# Patient Record
Sex: Female | Born: 1945 | Race: White | Hispanic: No | Marital: Single
Health system: Southern US, Community
[De-identification: ages and names within clinical notes are randomized; demographics above are authoritative.]

---

## 2004-03-25 ENCOUNTER — Ambulatory Visit: Payer: Self-pay | Admitting: Internal Medicine

## 2004-04-06 ENCOUNTER — Ambulatory Visit: Payer: Self-pay | Admitting: Internal Medicine

## 2004-11-23 ENCOUNTER — Ambulatory Visit: Payer: Self-pay | Admitting: Internal Medicine

## 2004-12-06 ENCOUNTER — Ambulatory Visit: Payer: Self-pay | Admitting: Internal Medicine

## 2005-01-03 ENCOUNTER — Emergency Department: Payer: Self-pay | Admitting: Emergency Medicine

## 2005-04-19 ENCOUNTER — Ambulatory Visit: Payer: Self-pay | Admitting: Internal Medicine

## 2005-06-06 ENCOUNTER — Ambulatory Visit: Payer: Self-pay | Admitting: Internal Medicine

## 2006-06-21 ENCOUNTER — Ambulatory Visit: Payer: Self-pay | Admitting: Internal Medicine

## 2007-08-06 ENCOUNTER — Ambulatory Visit: Payer: Self-pay | Admitting: Internal Medicine

## 2007-11-26 ENCOUNTER — Ambulatory Visit: Payer: Self-pay | Admitting: Gastroenterology

## 2008-01-14 ENCOUNTER — Ambulatory Visit: Payer: Self-pay | Admitting: Gastroenterology

## 2008-08-10 ENCOUNTER — Ambulatory Visit: Payer: Self-pay | Admitting: Family Medicine

## 2008-12-04 ENCOUNTER — Emergency Department: Payer: Self-pay | Admitting: Emergency Medicine

## 2009-09-27 ENCOUNTER — Ambulatory Visit: Payer: Self-pay | Admitting: Family Medicine

## 2010-12-15 ENCOUNTER — Ambulatory Visit: Payer: Self-pay | Admitting: Family Medicine

## 2011-05-29 LAB — URINALYSIS, COMPLETE
Bilirubin,UR: NEGATIVE
Glucose,UR: NEGATIVE mg/dL (ref 0–75)
Nitrite: NEGATIVE
Protein: 100
RBC,UR: 9 /HPF (ref 0–5)
Specific Gravity: 1.014 (ref 1.003–1.030)
WBC UR: 252 /HPF (ref 0–5)

## 2011-05-29 LAB — CBC
HGB: 12.7 g/dL (ref 12.0–16.0)
Platelet: 123 10*3/uL — ABNORMAL LOW (ref 150–440)
RBC: 3.81 10*6/uL (ref 3.80–5.20)
WBC: 19.3 10*3/uL — ABNORMAL HIGH (ref 3.6–11.0)

## 2011-05-29 LAB — COMPREHENSIVE METABOLIC PANEL
Albumin: 3 g/dL — ABNORMAL LOW (ref 3.4–5.0)
BUN: 24 mg/dL — ABNORMAL HIGH (ref 7–18)
Bilirubin,Total: 0.3 mg/dL (ref 0.2–1.0)
Calcium, Total: 9.1 mg/dL (ref 8.5–10.1)
Chloride: 99 mmol/L (ref 98–107)
Co2: 34 mmol/L — ABNORMAL HIGH (ref 21–32)
Creatinine: 2.25 mg/dL — ABNORMAL HIGH (ref 0.60–1.30)
Glucose: 134 mg/dL — ABNORMAL HIGH (ref 65–99)
Potassium: 3.8 mmol/L (ref 3.5–5.1)
Sodium: 141 mmol/L (ref 136–145)
Total Protein: 6.9 g/dL (ref 6.4–8.2)

## 2011-05-29 LAB — LIPASE, BLOOD: Lipase: 63 U/L — ABNORMAL LOW (ref 73–393)

## 2011-05-30 ENCOUNTER — Inpatient Hospital Stay: Payer: Self-pay | Admitting: Internal Medicine

## 2011-05-30 LAB — BASIC METABOLIC PANEL
Calcium, Total: 8.1 mg/dL — ABNORMAL LOW (ref 8.5–10.1)
Chloride: 107 mmol/L (ref 98–107)
Co2: 29 mmol/L (ref 21–32)
EGFR (African American): 47 — ABNORMAL LOW
Osmolality: 290 (ref 275–301)
Sodium: 143 mmol/L (ref 136–145)

## 2011-05-30 LAB — CBC WITH DIFFERENTIAL/PLATELET
Basophil #: 0 10*3/uL (ref 0.0–0.1)
Eosinophil %: 0 %
HCT: 33.7 % — ABNORMAL LOW (ref 35.0–47.0)
HGB: 11.2 g/dL — ABNORMAL LOW (ref 12.0–16.0)
MCH: 33.1 pg (ref 26.0–34.0)
MCHC: 33.2 g/dL (ref 32.0–36.0)
MCV: 100 fL (ref 80–100)
RBC: 3.38 10*6/uL — ABNORMAL LOW (ref 3.80–5.20)

## 2011-05-30 LAB — MAGNESIUM: Magnesium: 1.5 mg/dL — ABNORMAL LOW

## 2011-05-31 LAB — CBC WITH DIFFERENTIAL/PLATELET
Basophil #: 0 10*3/uL (ref 0.0–0.1)
Basophil %: 0.2 %
HCT: 33.4 % — ABNORMAL LOW (ref 35.0–47.0)
HGB: 11.2 g/dL — ABNORMAL LOW (ref 12.0–16.0)
Lymphocyte #: 1.3 10*3/uL (ref 1.0–3.6)
Lymphocyte %: 11.8 %
MCHC: 33.5 g/dL (ref 32.0–36.0)
MCV: 100 fL (ref 80–100)
Monocyte #: 0.8 10*3/uL — ABNORMAL HIGH (ref 0.0–0.7)
Monocyte %: 7.4 %
Neutrophil #: 9.1 10*3/uL — ABNORMAL HIGH (ref 1.4–6.5)
Platelet: 94 10*3/uL — ABNORMAL LOW (ref 150–440)
RBC: 3.35 10*6/uL — ABNORMAL LOW (ref 3.80–5.20)
RDW: 13.2 % (ref 11.5–14.5)
WBC: 11.3 10*3/uL — ABNORMAL HIGH (ref 3.6–11.0)

## 2011-05-31 LAB — BASIC METABOLIC PANEL
Anion Gap: 11 (ref 7–16)
BUN: 16 mg/dL (ref 7–18)
Calcium, Total: 8.3 mg/dL — ABNORMAL LOW (ref 8.5–10.1)
EGFR (Non-African Amer.): 58 — ABNORMAL LOW
Glucose: 136 mg/dL — ABNORMAL HIGH (ref 65–99)
Osmolality: 299 (ref 275–301)
Potassium: 3.3 mmol/L — ABNORMAL LOW (ref 3.5–5.1)

## 2011-05-31 LAB — TSH: Thyroid Stimulating Horm: 1.92 u[IU]/mL

## 2011-05-31 LAB — LIPID PANEL
Cholesterol: 115 mg/dL (ref 0–200)
HDL Cholesterol: 22 mg/dL — ABNORMAL LOW (ref 40–60)
Ldl Cholesterol, Calc: 52 mg/dL (ref 0–100)
VLDL Cholesterol, Calc: 41 mg/dL — ABNORMAL HIGH (ref 5–40)

## 2011-05-31 LAB — HEMOGLOBIN A1C: Hemoglobin A1C: 7 % — ABNORMAL HIGH (ref 4.2–6.3)

## 2011-05-31 LAB — VALPROIC ACID LEVEL: Valproic Acid: 62 ug/mL

## 2011-06-01 ENCOUNTER — Emergency Department: Payer: Self-pay | Admitting: Emergency Medicine

## 2011-06-01 LAB — CBC WITH DIFFERENTIAL/PLATELET
Basophil #: 0 10*3/uL (ref 0.0–0.1)
Basophil %: 0.2 %
Basophil %: 0.4 %
Eosinophil #: 0 10*3/uL (ref 0.0–0.7)
HCT: 33 % — ABNORMAL LOW (ref 35.0–47.0)
HGB: 11 g/dL — ABNORMAL LOW (ref 12.0–16.0)
Lymphocyte #: 1.2 10*3/uL (ref 1.0–3.6)
Lymphocyte #: 1.3 10*3/uL (ref 1.0–3.6)
Lymphocyte %: 13 %
MCH: 32.9 pg (ref 26.0–34.0)
MCHC: 33.5 g/dL (ref 32.0–36.0)
MCHC: 33.4 g/dL (ref 32.0–36.0)
MCV: 99 fL (ref 80–100)
Monocyte #: 1 10*3/uL — ABNORMAL HIGH (ref 0.0–0.7)
Monocyte %: 9.6 %
Neutrophil #: 7.1 10*3/uL — ABNORMAL HIGH (ref 1.4–6.5)
Neutrophil %: 77.1 %
Neutrophil %: 73 %
Platelet: 97 10*3/uL — ABNORMAL LOW (ref 150–440)
Platelet: 115 10*3/uL — ABNORMAL LOW (ref 150–440)
RBC: 3.34 10*6/uL — ABNORMAL LOW (ref 3.80–5.20)
RBC: 3.6 10*6/uL — ABNORMAL LOW (ref 3.80–5.20)
RDW: 13.1 % (ref 11.5–14.5)
RDW: 13.2 % (ref 11.5–14.5)
WBC: 9.1 10*3/uL (ref 3.6–11.0)
WBC: 8.8 10*3/uL (ref 3.6–11.0)

## 2011-06-01 LAB — COMPREHENSIVE METABOLIC PANEL
Albumin: 2.1 g/dL — ABNORMAL LOW (ref 3.4–5.0)
Anion Gap: 8 (ref 7–16)
BUN: 11 mg/dL (ref 7–18)
Chloride: 104 mmol/L (ref 98–107)
Creatinine: 0.87 mg/dL (ref 0.60–1.30)
EGFR (Non-African Amer.): >60
Osmolality: 288 (ref 275–301)
Potassium: 3.7 mmol/L (ref 3.5–5.1)
Total Protein: 6.2 g/dL — ABNORMAL LOW (ref 6.4–8.2)

## 2011-06-01 LAB — URINALYSIS, COMPLETE
Bilirubin,UR: NEGATIVE
Ketone: NEGATIVE
Nitrite: NEGATIVE
Ph: 7 (ref 4.5–8.0)
Protein: 30
RBC,UR: 5 /HPF (ref 0–5)
Specific Gravity: 1.01 (ref 1.003–1.030)

## 2011-06-01 LAB — BASIC METABOLIC PANEL
Calcium, Total: 8.3 mg/dL — ABNORMAL LOW (ref 8.5–10.1)
Chloride: 105 mmol/L (ref 98–107)
Co2: 26 mmol/L (ref 21–32)
Creatinine: 0.93 mg/dL (ref 0.60–1.30)
EGFR (African American): >60
Glucose: 164 mg/dL — ABNORMAL HIGH (ref 65–99)
Osmolality: 280 (ref 275–301)

## 2011-06-01 LAB — VALPROIC ACID LEVEL: Valproic Acid: 22 ug/mL — ABNORMAL LOW

## 2011-06-01 LAB — URINE CULTURE

## 2011-06-03 ENCOUNTER — Emergency Department: Payer: Self-pay | Admitting: Emergency Medicine

## 2011-06-03 LAB — CBC
HCT: 38 % (ref 35.0–47.0)
HGB: 12.7 g/dL (ref 12.0–16.0)
MCH: 33.2 pg (ref 26.0–34.0)
MCHC: 33.5 g/dL (ref 32.0–36.0)
Platelet: 144 10*3/uL — ABNORMAL LOW (ref 150–440)
WBC: 8.5 10*3/uL (ref 3.6–11.0)

## 2011-06-03 LAB — BASIC METABOLIC PANEL
Anion Gap: 10 (ref 7–16)
BUN: 13 mg/dL (ref 7–18)
Chloride: 101 mmol/L (ref 98–107)
EGFR (Non-African Amer.): 58 — ABNORMAL LOW
Glucose: 117 mg/dL — ABNORMAL HIGH (ref 65–99)
Osmolality: 286 (ref 275–301)

## 2011-06-03 LAB — URINALYSIS, COMPLETE
Bilirubin,UR: NEGATIVE
Blood: NEGATIVE
Ketone: NEGATIVE
Leukocyte Esterase: NEGATIVE
Nitrite: NEGATIVE
Ph: 7 (ref 4.5–8.0)
Squamous Epithelial: NONE SEEN

## 2012-01-02 ENCOUNTER — Ambulatory Visit: Payer: Self-pay | Admitting: Gastroenterology

## 2012-01-05 ENCOUNTER — Ambulatory Visit: Payer: Self-pay

## 2012-01-12 LAB — CBC
HCT: 35 % (ref 35.0–47.0)
HGB: 11.6 g/dL — ABNORMAL LOW (ref 12.0–16.0)
MCH: 33.2 pg (ref 26.0–34.0)
MCV: 100 fL (ref 80–100)
RDW: 15.4 % — ABNORMAL HIGH (ref 11.5–14.5)
WBC: 5.9 10*3/uL (ref 3.6–11.0)

## 2012-01-12 LAB — COMPREHENSIVE METABOLIC PANEL
BUN: 16 mg/dL (ref 7–18)
Chloride: 103 mmol/L (ref 98–107)
EGFR (African American): >60
EGFR (Non-African Amer.): >60
SGOT(AST): 30 U/L (ref 15–37)
SGPT (ALT): 40 U/L (ref 12–78)
Sodium: 144 mmol/L (ref 136–145)
Total Protein: 6.9 g/dL (ref 6.4–8.2)

## 2012-01-12 LAB — URINALYSIS, COMPLETE
Glucose,UR: NEGATIVE mg/dL (ref 0–75)
Protein: NEGATIVE
RBC,UR: <1 /HPF (ref 0–5)
Specific Gravity: 1.012 (ref 1.003–1.030)
WBC UR: <1 /HPF (ref 0–5)

## 2012-01-12 LAB — TROPONIN I: Troponin-I: <0.02 ng/mL

## 2012-01-12 LAB — VALPROIC ACID LEVEL: Valproic Acid: 57 ug/mL

## 2012-01-12 LAB — TSH: Thyroid Stimulating Horm: 5.52 u[IU]/mL — ABNORMAL HIGH

## 2012-01-12 LAB — PRO B NATRIURETIC PEPTIDE: B-Type Natriuretic Peptide: 57 pg/mL (ref 0–125)

## 2012-01-12 LAB — PROTIME-INR: Prothrombin Time: 12.4 secs (ref 11.5–14.7)

## 2012-01-13 ENCOUNTER — Inpatient Hospital Stay: Payer: Self-pay | Admitting: Internal Medicine

## 2012-01-13 LAB — CBC WITH DIFFERENTIAL/PLATELET
Basophil #: 0 10*3/uL (ref 0.0–0.1)
HCT: 36.4 % (ref 35.0–47.0)
Lymphocyte #: 2.1 10*3/uL (ref 1.0–3.6)
MCH: 33.5 pg (ref 26.0–34.0)
MCHC: 34 g/dL (ref 32.0–36.0)
MCV: 99 fL (ref 80–100)
Monocyte #: 0.2 x10 3/mm (ref 0.2–0.9)
Monocyte %: 3.6 %
Neutrophil #: 3.4 10*3/uL (ref 1.4–6.5)
Neutrophil %: 59.3 %
RDW: 15.5 % — ABNORMAL HIGH (ref 11.5–14.5)
WBC: 5.7 10*3/uL (ref 3.6–11.0)

## 2012-01-13 LAB — VALPROIC ACID LEVEL: Valproic Acid: 41 ug/mL — ABNORMAL LOW

## 2012-01-13 LAB — TROPONIN I: Troponin-I: <0.02 ng/mL

## 2012-01-13 LAB — CK TOTAL AND CKMB (NOT AT ARMC)
CK, Total: 392 U/L — ABNORMAL HIGH (ref 21–215)
CK-MB: 3.1 ng/mL (ref 0.5–3.6)

## 2012-01-13 LAB — AMMONIA: Ammonia, Plasma: 36 mcmol/L — ABNORMAL HIGH (ref 11–32)

## 2012-01-14 ENCOUNTER — Ambulatory Visit: Payer: Self-pay | Admitting: Internal Medicine

## 2012-01-14 LAB — COMPREHENSIVE METABOLIC PANEL
Alkaline Phosphatase: 124 U/L (ref 50–136)
Anion Gap: 6 — ABNORMAL LOW (ref 7–16)
BUN: 13 mg/dL (ref 7–18)
Bilirubin,Total: 0.3 mg/dL (ref 0.2–1.0)
Calcium, Total: 8.6 mg/dL (ref 8.5–10.1)
Chloride: 108 mmol/L — ABNORMAL HIGH (ref 98–107)
Co2: 31 mmol/L (ref 21–32)
Creatinine: 1.25 mg/dL (ref 0.60–1.30)
EGFR (African American): 53 — ABNORMAL LOW
EGFR (Non-African Amer.): 45 — ABNORMAL LOW
Glucose: 159 mg/dL — ABNORMAL HIGH (ref 65–99)
Osmolality: 292 (ref 275–301)
Potassium: 3.6 mmol/L (ref 3.5–5.1)
SGOT(AST): 20 U/L (ref 15–37)
SGPT (ALT): 25 U/L (ref 12–78)
Total Protein: 6.1 g/dL — ABNORMAL LOW (ref 6.4–8.2)

## 2012-01-14 LAB — CBC WITH DIFFERENTIAL/PLATELET
Basophil #: 0.1 10*3/uL (ref 0.0–0.1)
Basophil %: 0.8 %
Eosinophil #: 0 10*3/uL (ref 0.0–0.7)
HCT: 30.9 % — ABNORMAL LOW (ref 35.0–47.0)
HGB: 10.3 g/dL — ABNORMAL LOW (ref 12.0–16.0)
Lymphocyte #: 1.7 10*3/uL (ref 1.0–3.6)
MCH: 33.7 pg (ref 26.0–34.0)
MCHC: 33.3 g/dL (ref 32.0–36.0)
MCV: 101 fL — ABNORMAL HIGH (ref 80–100)
Monocyte #: 0.9 x10 3/mm (ref 0.2–0.9)
Neutrophil #: 6.8 10*3/uL — ABNORMAL HIGH (ref 1.4–6.5)
RDW: 15.4 % — ABNORMAL HIGH (ref 11.5–14.5)

## 2012-01-14 LAB — URINE CULTURE

## 2012-01-15 LAB — CBC WITH DIFFERENTIAL/PLATELET
Basophil %: 0.2 %
Eosinophil %: 0.1 %
HGB: 9.9 g/dL — ABNORMAL LOW (ref 12.0–16.0)
Lymphocyte #: 1.6 10*3/uL (ref 1.0–3.6)
MCH: 33.5 pg (ref 26.0–34.0)
MCV: 100 fL (ref 80–100)
Monocyte #: 0.4 x10 3/mm (ref 0.2–0.9)
Neutrophil #: 5.2 10*3/uL (ref 1.4–6.5)

## 2012-01-15 LAB — TROPONIN I: Troponin-I: <0.02 ng/mL

## 2012-01-15 LAB — CK-MB: CK-MB: 1.3 ng/mL (ref 0.5–3.6)

## 2012-01-15 LAB — CK: CK, Total: 238 U/L — ABNORMAL HIGH (ref 21–215)

## 2012-01-16 LAB — CBC WITH DIFFERENTIAL/PLATELET
Basophil #: 0 10*3/uL (ref 0.0–0.1)
Basophil %: 0.1 %
Eosinophil #: 0 10*3/uL (ref 0.0–0.7)
HCT: 28.4 % — ABNORMAL LOW (ref 35.0–47.0)
HGB: 9.7 g/dL — ABNORMAL LOW (ref 12.0–16.0)
Lymphocyte #: 0.9 10*3/uL — ABNORMAL LOW (ref 1.0–3.6)
Lymphocyte %: 14.8 %
MCHC: 34.2 g/dL (ref 32.0–36.0)
Monocyte %: 1.5 %
Neutrophil #: 5.2 10*3/uL (ref 1.4–6.5)
Neutrophil %: 83.6 %
RBC: 2.85 10*6/uL — ABNORMAL LOW (ref 3.80–5.20)
RDW: 15.4 % — ABNORMAL HIGH (ref 11.5–14.5)
WBC: 6.2 10*3/uL (ref 3.6–11.0)

## 2012-01-16 LAB — LACTATE DEHYDROGENASE: LDH: 208 U/L (ref 81–234)

## 2012-01-16 LAB — FIBRIN DEGRADATION PROD.(ARMC ONLY): Fibrin Degradation Prod.: <10 (ref 2.1–7.7)

## 2012-01-16 LAB — BASIC METABOLIC PANEL
Anion Gap: 5 — ABNORMAL LOW (ref 7–16)
BUN: 10 mg/dL (ref 7–18)
Chloride: 106 mmol/L (ref 98–107)
Co2: 33 mmol/L — ABNORMAL HIGH (ref 21–32)
EGFR (Non-African Amer.): >60
Glucose: 135 mg/dL — ABNORMAL HIGH (ref 65–99)
Osmolality: 288 (ref 275–301)
Potassium: 3.7 mmol/L (ref 3.5–5.1)
Sodium: 144 mmol/L (ref 136–145)

## 2012-01-16 LAB — IRON AND TIBC
Iron Bind.Cap.(Total): 233 ug/dL — ABNORMAL LOW (ref 250–450)
Iron Saturation: 44 %
Unbound Iron-Bind.Cap.: 130 ug/dL

## 2012-01-16 LAB — APTT: Activated PTT: 31.9 secs (ref 23.6–35.9)

## 2012-01-16 LAB — FOLATE: Folic Acid: 11.8 ng/mL (ref 3.1–100.0)

## 2012-01-16 LAB — PROTIME-INR: INR: 0.9

## 2012-01-17 LAB — CBC WITH DIFFERENTIAL/PLATELET
Basophil #: 0 10*3/uL (ref 0.0–0.1)
Basophil %: 0.5 %
Eosinophil #: 0 10*3/uL (ref 0.0–0.7)
HCT: 27.9 % — ABNORMAL LOW (ref 35.0–47.0)
HGB: 9.4 g/dL — ABNORMAL LOW (ref 12.0–16.0)
Lymphocyte %: 16.1 %
MCHC: 33.7 g/dL (ref 32.0–36.0)
MCV: 100 fL (ref 80–100)
Monocyte %: 2.8 %
Neutrophil #: 5.7 10*3/uL (ref 1.4–6.5)
Neutrophil %: 80.6 %
WBC: 7 10*3/uL (ref 3.6–11.0)

## 2012-01-17 LAB — VANCOMYCIN, TROUGH: Vancomycin, Trough: 7 ug/mL — ABNORMAL LOW (ref 10–20)

## 2012-01-18 LAB — CBC WITH DIFFERENTIAL/PLATELET
Basophil %: 0.1 %
HCT: 27.9 % — ABNORMAL LOW (ref 35.0–47.0)
Lymphocyte #: 1.1 10*3/uL (ref 1.0–3.6)
Lymphocyte %: 16.4 %
MCH: 32.7 pg (ref 26.0–34.0)
MCHC: 33.1 g/dL (ref 32.0–36.0)
MCV: 99 fL (ref 80–100)
Monocyte #: 0.2 x10 3/mm (ref 0.2–0.9)
Monocyte %: 3.8 %
Neutrophil #: 5.2 10*3/uL (ref 1.4–6.5)
Platelet: 61 10*3/uL — ABNORMAL LOW (ref 150–440)
RDW: 15.3 % — ABNORMAL HIGH (ref 11.5–14.5)
WBC: 6.5 10*3/uL (ref 3.6–11.0)

## 2012-01-18 LAB — PHOSPHORUS: Phosphorus: 3.1 mg/dL (ref 2.5–4.9)

## 2012-01-18 LAB — CULTURE, BLOOD (SINGLE)

## 2012-01-19 LAB — CBC WITH DIFFERENTIAL/PLATELET
Eosinophil %: 0 %
HCT: 25.7 % — ABNORMAL LOW (ref 35.0–47.0)
HGB: 8.2 g/dL — ABNORMAL LOW (ref 12.0–16.0)
Lymphocyte #: 0.8 10*3/uL — ABNORMAL LOW (ref 1.0–3.6)
MCH: 31.7 pg (ref 26.0–34.0)
MCHC: 32 g/dL (ref 32.0–36.0)
MCV: 99 fL (ref 80–100)
Monocyte #: 0.2 x10 3/mm (ref 0.2–0.9)
Monocyte %: 2.7 %
Neutrophil #: 5.3 10*3/uL (ref 1.4–6.5)
Platelet: 80 10*3/uL — ABNORMAL LOW (ref 150–440)
RBC: 2.59 10*6/uL — ABNORMAL LOW (ref 3.80–5.20)
WBC: 6.3 10*3/uL (ref 3.6–11.0)

## 2012-01-20 LAB — CBC WITH DIFFERENTIAL/PLATELET
Basophil #: 0 10*3/uL (ref 0.0–0.1)
Eosinophil #: 0 10*3/uL (ref 0.0–0.7)
Eosinophil %: 0 %
HCT: 26.6 % — ABNORMAL LOW (ref 35.0–47.0)
HGB: 8.8 g/dL — ABNORMAL LOW (ref 12.0–16.0)
MCH: 33 pg (ref 26.0–34.0)
MCHC: 33.1 g/dL (ref 32.0–36.0)
Monocyte #: 0.2 x10 3/mm (ref 0.2–0.9)
Monocyte %: 3.1 %
Neutrophil %: 81.4 %
Platelet: 105 10*3/uL — ABNORMAL LOW (ref 150–440)
WBC: 7.2 10*3/uL (ref 3.6–11.0)

## 2012-01-20 LAB — OCCULT BLOOD X 1 CARD TO LAB, STOOL: Occult Blood, Feces: NEGATIVE

## 2012-01-22 LAB — CBC WITH DIFFERENTIAL/PLATELET
Basophil #: 0 10*3/uL (ref 0.0–0.1)
Lymphocyte #: 1.7 10*3/uL (ref 1.0–3.6)
MCH: 33.2 pg (ref 26.0–34.0)
MCHC: 33 g/dL (ref 32.0–36.0)
MCV: 100 fL (ref 80–100)
Monocyte #: 0.4 x10 3/mm (ref 0.2–0.9)
Monocyte %: 4.7 %
Neutrophil #: 5.6 10*3/uL (ref 1.4–6.5)
RBC: 2.75 10*6/uL — ABNORMAL LOW (ref 3.80–5.20)
RDW: 15.9 % — ABNORMAL HIGH (ref 11.5–14.5)

## 2012-02-13 ENCOUNTER — Ambulatory Visit: Payer: Self-pay | Admitting: Internal Medicine

## 2013-05-16 ENCOUNTER — Other Ambulatory Visit: Payer: Self-pay | Admitting: Family Medicine

## 2013-05-16 LAB — URINALYSIS, COMPLETE
BILIRUBIN, UR: NEGATIVE
Blood: NEGATIVE
Glucose,UR: NEGATIVE mg/dL (ref 0–75)
KETONE: NEGATIVE
NITRITE: NEGATIVE
Ph: 5 (ref 4.5–8.0)
Specific Gravity: 1.019 (ref 1.003–1.030)
Squamous Epithelial: NONE SEEN
WBC UR: 243 /HPF (ref 0–5)

## 2013-05-18 LAB — URINE CULTURE

## 2013-06-17 IMAGING — CR DG CHEST 1V PORT
1 series · 1 of 1 positions shown · non-contrast
Comparison: none

REASON FOR EXAM: cough
COMMENTS:

[ap]
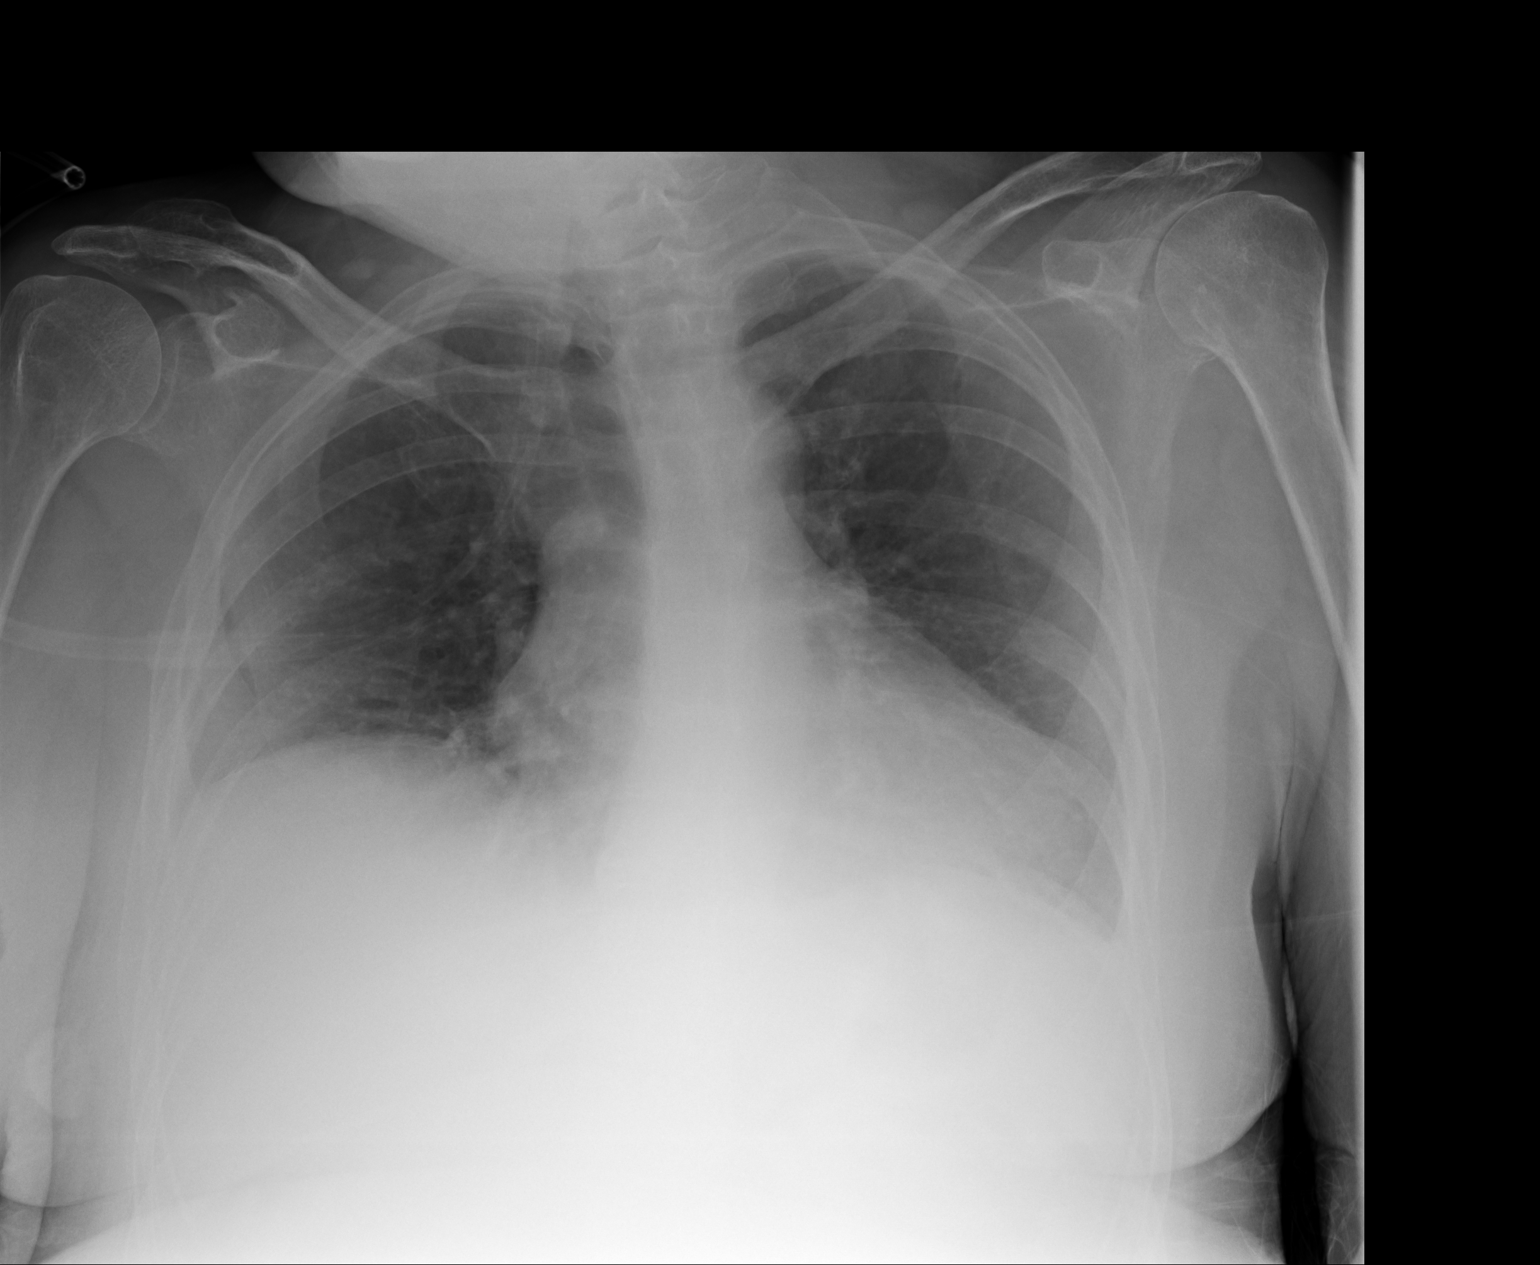

[1 of 1 positions shown; findings below may reference images not displayed]

PROCEDURE:     DXR - DXR PORTABLE CHEST SINGLE VIEW  - January 12, 2012  [DATE]

RESULT:     Comparison is made to a prior study dated 06/01/2011.

The patient has taken a shallow inspiration. There is prominence of the
interstitial markings. No focal regions of consolidation are identified. The
cardiac silhouette is within normal limits. The visualized bony skeleton is
unremarkable.
IMPRESSION: 1. Shallow inspiration.
2. Interstitial prominence which may represent a component of   pulmonary
vascular congestion/mild edema. Surveillance evaluation recommended.

## 2013-06-17 IMAGING — CR DG CHEST 1V PORT
1 series · 1 of 1 positions shown · non-contrast
Comparison: none

REASON FOR EXAM: post intubation
COMMENTS:

[portable]
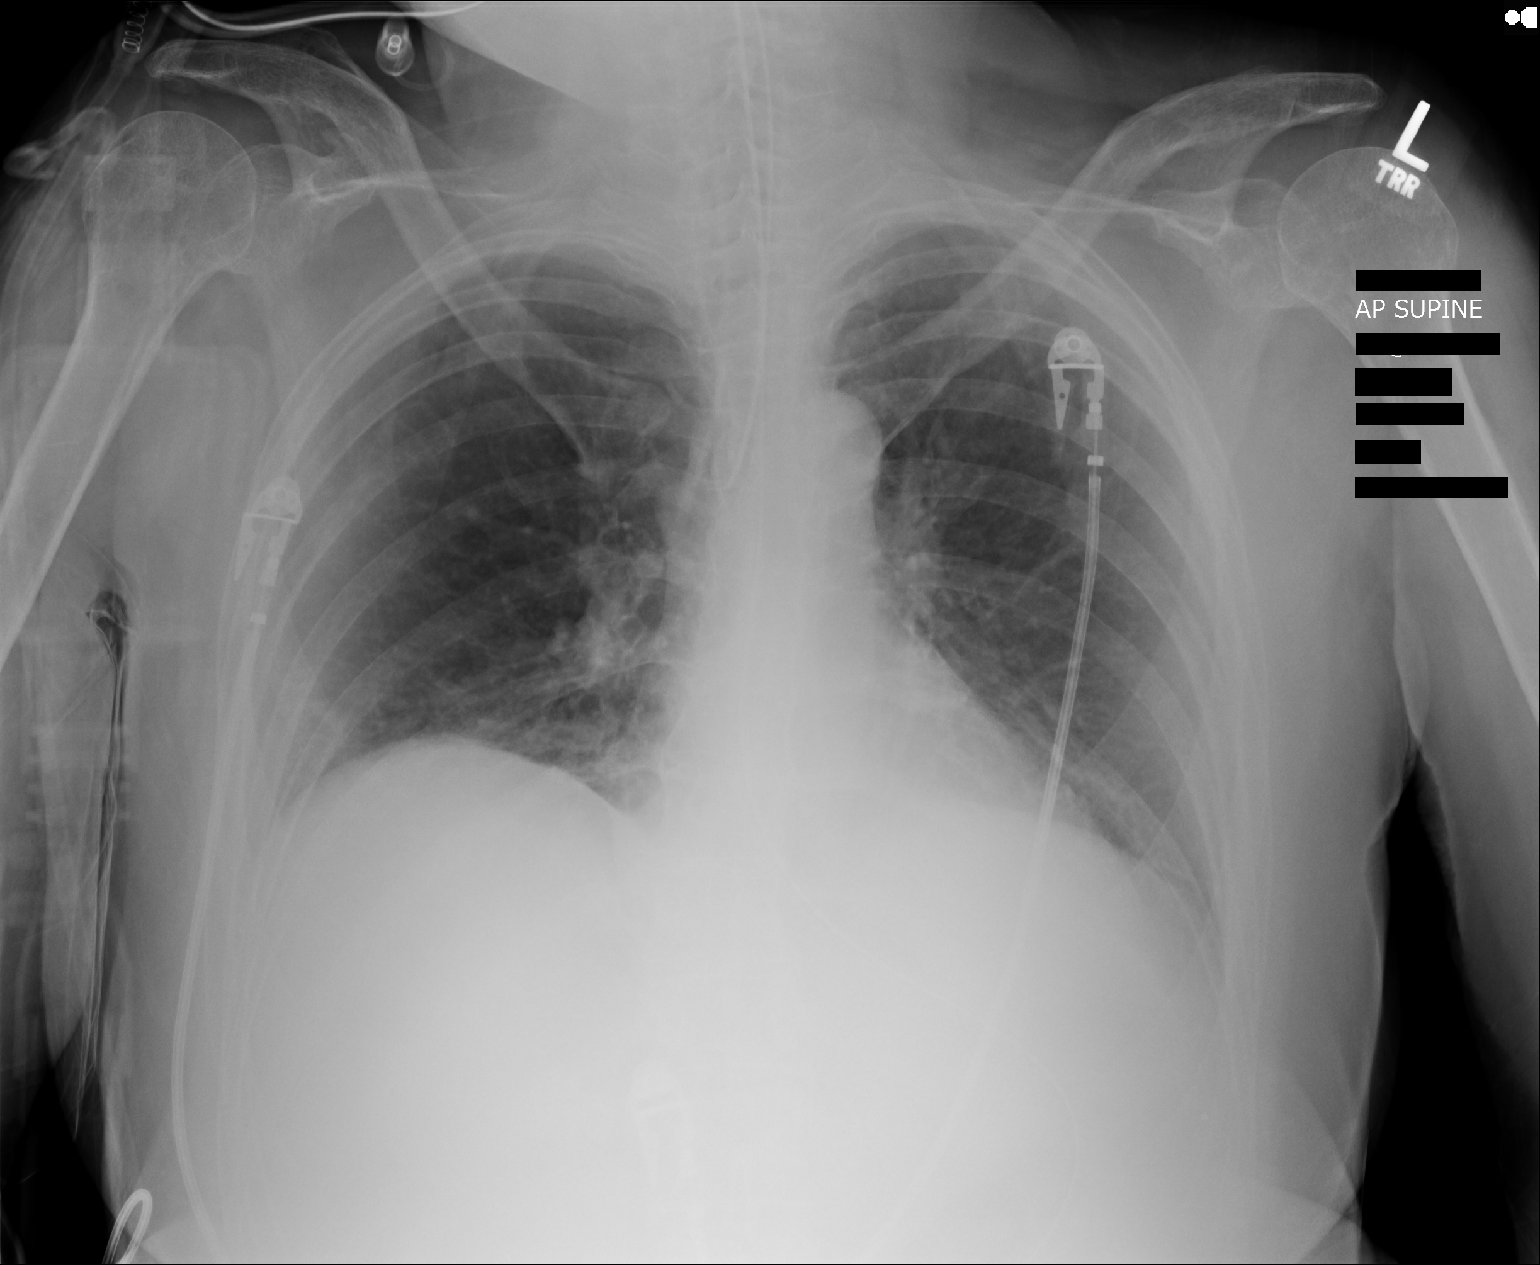

[1 of 1 positions shown; findings below may reference images not displayed]

PROCEDURE:     DXR - DXR PORTABLE CHEST SINGLE VIEW  - January 12, 2012 [DATE]

RESULT:     Comparison is made to a prior study dated same date earlier time.

Endotracheal tube has been placed in the interim with the tip directed
towards the right mainstem bronchus at the level of the carina. Retraction
of approximately 1.5 to 2.0 cm is recommended. The patient has otherwise
taken a shallow inspiration. NG tube is seen with tip not on the view this
study. The visualized bony skeleton and cardiac silhouette are grossly
unremarkable.
IMPRESSION: 1.     Support lines and tubes as described above.
2.     Retraction of the endotracheal tube to approximately 1.5 to 2.0 cm is
recommended.

## 2013-06-19 IMAGING — CR DG CHEST 1V PORT
1 series · 1 of 1 positions shown · non-contrast
Comparison: none

REASON FOR EXAM: evaluate tube placement
COMMENTS:

[portable]
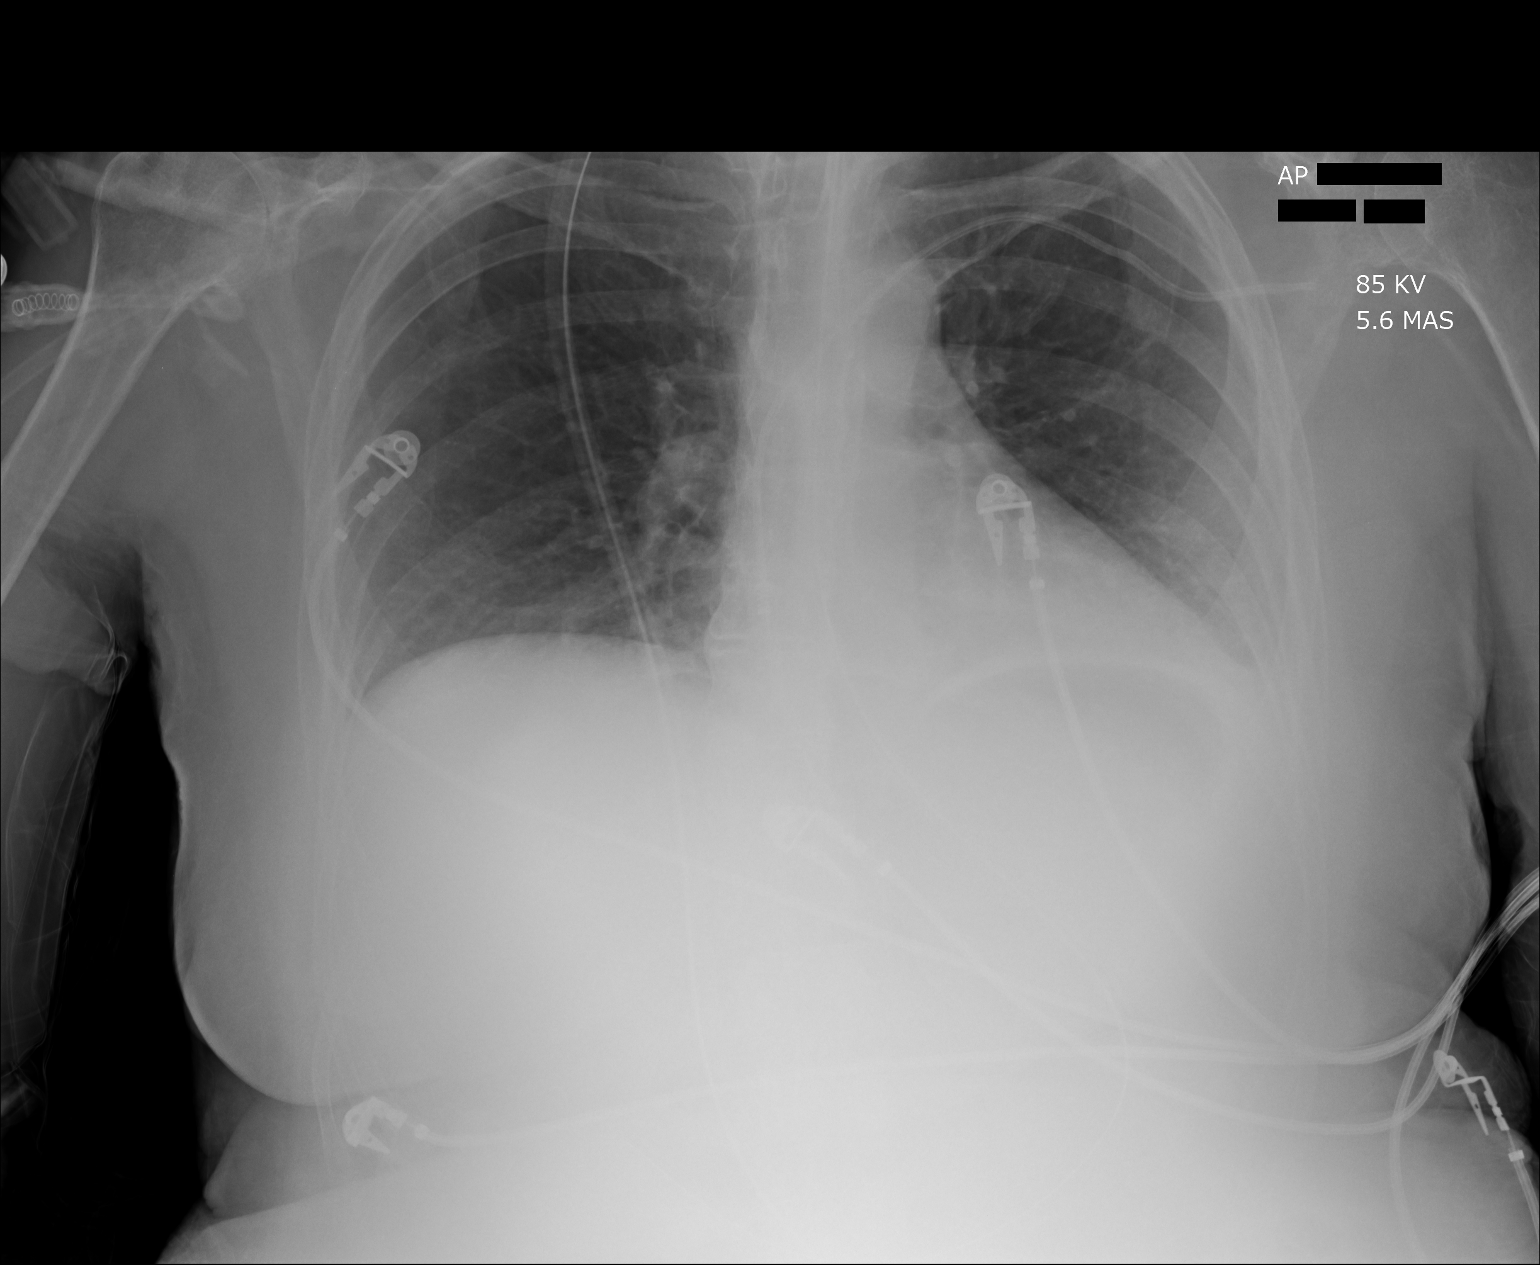

[1 of 1 positions shown; findings below may reference images not displayed]

PROCEDURE:     DXR - DXR PORTABLE CHEST SINGLE VIEW  - January 14, 2012  [DATE]

RESULT:     Comparison is made to prior study same date earlier time.

Endotracheal tube is appreciated with the tip at the level of the clavicles.
A left-sided central venous catheter is appreciated with the tip projecting
in the region of the superior vena cava/right atrial junction. The patient
has taken shallow inspiration. With technique taken into consideration,
there are no focal regions of consolidation. There is blunting of the left
costophrenic angle. The cardiac silhouette is within normal limits. The
visualized bony skeleton is unremarkable.
IMPRESSION: 1. Shallow inspiration.
2. Scarring versus possibly trace effusion on the left.
3. Support lines and tubes as described above.

## 2013-07-13 ENCOUNTER — Other Ambulatory Visit: Payer: Self-pay

## 2013-07-13 LAB — URINALYSIS, COMPLETE
BILIRUBIN, UR: NEGATIVE
Blood: NEGATIVE
Glucose,UR: NEGATIVE mg/dL (ref 0–75)
Ketone: NEGATIVE
NITRITE: NEGATIVE
PH: 7 (ref 4.5–8.0)
Protein: NEGATIVE
RBC,UR: 1 /HPF (ref 0–5)
SQUAMOUS EPITHELIAL: NONE SEEN
Specific Gravity: 1.014 (ref 1.003–1.030)
WBC UR: 15 /HPF (ref 0–5)

## 2013-07-14 LAB — URINE CULTURE

## 2014-09-01 NOTE — Consult Note (Signed)
PATIENT NAME:  Shannon Shannon [name], Shannon Shannon [name] MR#:  [id number] DATE OF BIRTH:  November 06, 1945  DATE OF CONSULTATION:  01/14/2012  REFERRING PHYSICIAN:   CONSULTING PHYSICIAN:  Laurier NancyShaukat [name]. Meredeth Furber, MD  INDICATION FOR CONSULTATION: Bradycardia.   HISTORY OF PRESENT ILLNESS: This is Shannon [name] 69 year old white female who [name] and apparently had an ulcer on the foot and was kind of having malaise and not feeling her usual self. She was sent to the hospital for evaluation where she was found to be short of breath and hypoxic and initially was started on 100% nonrebreather and ended up being intubated and gradually became hypotensive so initially dopamine was started then Levothroid was started. The patient was bradycardic in the 40s and thus I was asked to evaluate the patient. The patient remains intubated, unable to get full history. However right now heart rate is 60, blood pressure 114/70, and respirations 18. She appears to be what seems like oversedated. Not much other history is available.   PHYSICAL EXAMINATION:  NECK: No JVD.   LUNGS: Good air entry.   HEART: Regular rate and rhythm. Normal S1 and S2. No audible murmur.  ABDOMEN: Soft and nontender, positive bowel sounds.   EXTREMITIES: No pedal edema.   NEUROLOGIC: She appears to be alert and oriented x0.   LABS AND DIAGNOSTICS: Her EKG shows ectopic atrial rhythm, rate 57 beats per minute, low voltage, nonspecific ST-T changes.  BUN and creatinine are 13 and 1.25.  Cardiac enzymes: CPK is 392, MB fraction is 3.1, and troponin 0.02. White count is 9.5, hemoglobin 10.3, and hematocrit 30.9.   ASSESSMENT AND PLAN: Hypotension. Blood cultures are negative. The patient was hypoxic with respiratory failure. Still it appears that bradycardia may be related to over sedation. Cut back on sedation. Increase dopamine or Levophed; Levophed should be 5 mcg and dopamine should be 20 mcg. If still bradycardic change Levothroid to epinephrine. Also get an  echocardiogram, another EKG, and another three sets of cardiac enzymes with troponin.   Thank you very much for the referral.  ____________________________ Laurier NancyShaukat [name]. Tyneshia Stivers, MD sak:slb D: 01/14/2012 14:16:10 ET T: 01/14/2012 14:43:17 ET JOB#: [id number]  cc: Laurier NancyShaukat [facility] [name]. Marjie Chea, MD, <Dictator> Laurier NancySHAUKAT Shannon [name] Amaia Lavallie MD ELECTRONICALLY SIGNED 02/19/2012 15:05

## 2014-09-01 NOTE — Discharge Summary (Signed)
PATIENT NAME:  Shannon [name], WAMBOLT MR#:  [id number] DATE OF BIRTH:  1945-05-19  DATE OF ADMISSION:  01/13/2012 DATE OF DISCHARGE:  01/23/2012  DISCHARGE DIAGNOSIS: Recurrent aspiration pneumonia.   SECONDARY DIAGNOSES:  1. Acute respiratory failure likely due to aspiration pneumonia.  2. Septic shock due to pneumonia.  3. Bradycardia, stable. 4. Thrombocytopenia, likely due to sepsis.  5. Respiratory alkalosis.  6. Anemia of chronic disease.  7. Type 2 diabetes.  8. Hypertension.  9. Gastroesophageal reflux disease.  10. Profound mental retardation.  11. Dyslipidemia.  12. Conjunctivitis.  13. Anxiety. 14. Hypothyroidism.  15. Arthritis.   CONSULTATIONS:  1. Pulmonary, Dr. Welton Flakes. 2. Cardiology, Dr. Welton Flakes.  3. Oncology, Dr. Sherrlyn Hock.   4. Speech therapy.  5. Palliative care.   PROCEDURES/RADIOLOGY:  1. CT scan of the head without contrast on 08/30 showed no evidence of acute intracranial abnormality.  2. Chest x-ray on 08/30 showed pulmonary vascular congestion/mild edema.  3. Chest x-ray on 08/30 at 10:16 p.m. showed support lines and tubes in appropriate positions. Retraction of endotracheal tube approximately 1.5 to 2 cm recommended.  4. Chest x-ray on 08/31 showed interval placement of left-sided central venous catheter without evidence of pneumothorax. No further changes from previous x-ray.  5. Chest x-ray on 09/01 showed left subclavian central venous catheter deep in the right atrium. Apparent interval extubation.  NG tube present.  6. Chest x-ray on 09/01 at 7:40 showed shallow inspiration. Scarring versus possible trace effusion on the left.  7. Chest x-ray on 09/03 showed endotracheal tube in appropriate position. Some advancement recommended. Left-sided central venous catheter with tip projecting in the region of SVC/right atrial junction.  8. 2-D echocardiogram on 09/01 showed normal LV size and LV systolic function. Mild diastolic dysfunction. Mild pulmonary  hypertension. Ejection fraction more than 55%.   LABORATORY DATA: Urinalysis on admission was negative. Blood cultures times two on admission were negative. Urine culture was negative. Blood cultures times two on 09/01 were negative. Serum platelet antibody all negative except 1B/9, which was positive. Direct platelet antibody was positive except platelet-associated anti 1A/2A, which was negative. Haptoglobin was elevated with a value of 202. Heparin-induced platelet antibody was within normal limits. HBsAg was negative. Hepatitis C virus antibody was within normal limits. Serum vitamin B12 level was elevated with a value of 1362. Hemoccult blood in the stool was negative. Stool for C. difficile was negative on 09/07. Stool for WBC was negative.   HISTORY AND SHORT HOSPITAL COURSE: The patient is a 69 year old female with the above-mentioned medical problems who was admitted for acute respiratory failure, thought to be likely aspiration pneumonia. Please see Dr. Antionette Poles dictated History and Physical for further details. The patient was also found to be septic with possible septic shock and thrombocytopenia, likely due to the same. Pulmonary consultation was obtained with Dr. Freda Munro who recommended broad-spectrum antibiotic coverage and the patient was placed on a ventilator due to respiratory failure. She was also started on pressors due to her hypotension due to septic shock. Cardiology consultation was obtained with Dr. Adrian Blackwater considering significant bradycardia and he recommended increasing the pressors and cutting back on sedation, which was done. She underwent insertion of left subclavian line by Dr. Gilda Crease on the 31st of August due to requirement of a lot of intravenous medication. She was evaluated by oncology, Dr. Sherrlyn Hock, concerning her thrombocytopenia. This was thought to be likely due to sepsis but possible DIC, medication induced, or idiopathic thrombocytopenia purpura were not ruled out  at that time. The patient continued to remain critically ill. She was unable to be weaned down on ventilator with her ongoing critical illness. Palliative care consultation was obtained with Dr. Harvie JuniorPhifer and team. The patient was eventually extubated on 01/19/2012 with intent not to reintubate as per the family wishes. The patient was subsequently transferred to the medical floor. She was still requiring about 6 liters of oxygen. She failed a swallowing evaluation also by speech therapy. Family and patient did not want PEG tube. After a lengthy discussion with the family by palliative care, the decision was made to transfer her to the [facility]ospice home.  She had a bed available on 09/10 and was discharged to the Hospice home in critical condition.   VITAL SIGNS: On the date of discharge her vital signs are as follows: Temperature 97.9, heart rate 62 per minute, respirations 22 per minute, blood pressure 139/71 mmHg.  She was saturating 92% on six liters of oxygen by nasal cannula.   PERTINENT PHYSICAL EXAMINATION ON THE DATE OF DISCHARGE: CARDIOVASCULAR: Bradycardic. No murmurs, rubs, or gallop. S1, S2 normal. LUNGS: Occasional rhonchi. Normal respiratory effort. Occasional use of accessory muscles. ABDOMEN: Soft, benign. NEUROLOGIC:  She will open her eyes but does not follow commands. She remains critically ill.  Other physical examination remained at baseline.   DISCHARGE MEDICATIONS:  1. Roxanol 0.25 to 0.5 mL p.o./sublingual every 1 to 2 hours as needed.  2. Ativan 0.5 to 1 mg p.o./sublingual every 2 to 4 hours as needed.  3. Ranitidine 150 mg p.o. b.i.d.  4. ABHR suppository one per rectum every 4 to 6 hours as needed.  5. Levothyroxine 50 mcg p.o. daily.   DISCHARGE DIET: As tolerated.   DISCHARGE ACTIVITY: As tolerated.   DISCHARGE INSTRUCTIONS AND FOLLOWUP:  1. The patient's medication may need to be crushed or use liquid when appropriate. May change to rectal route if unable to swallow.   2. CODE STATUS: DO NOT RESUSCITATE.  3. Oxygen can be used anywhere from 2 to 6 liters as needed.  4. Foley catheter to be left indwelling to  prevent skin breakdown or urinary incontinence.          TOTAL TIME DISCHARGING THIS PATIENT: 55 minutes.    ____________________________ Ellamae SiaVipul S. Sherryll BurgerShah, MD vss:bjt D: 01/26/2012 13:01:00 ET T: 01/27/2012 09:48:39 ET JOB#: [id number]  cc: Pandora Mccrackin S. Sherryll BurgerShah, MD, <Dictator> Marina Goodellale E. Feldpausch, MD Yevonne PaxSaadat A. Khan, MD Laurier NancyShaukat A. Khan, MD Ned GraceNancy Phifer, MD Maren ReamerSandeep R. Sherrlyn HockPandit, MD Ellamae SiaVIPUL S [facility] MD ELECTRONICALLY SIGNED 01/28/2012 16:17

## 2014-09-01 NOTE — Consult Note (Signed)
PATIENT NAME:  Shannon [name], [name] MR#:  [id number] DATE OF BIRTH:  03-Sep-1945  DATE OF CONSULTATION:  01/13/2012  REFERRING PHYSICIAN:   CONSULTING PHYSICIAN:  Yevonne PaxSaadat A. Khan, MD  REASON FOR CONSULTATION: Acute respiratory failure and sepsis.   HISTORY OF PRESENT ILLNESS: This is a 69 year old white female who has a history of mental retardation. She was found lethargic. The patient apparently had a rectal temperature of  89 degrees and she has had chronic wounds and has been treated for this by Dr. Thurmond ButtsWade. The patient has been currently on antibiotics. At the time that she is seen, she is orally intubated. She is unresponsive and is on the ventilator. She was extremely alkalotic when she came in and this is probably due to the diarrhea that she had been having going on for about four days. The patient also had been having some nausea along with vomiting. There is a question also whether or not she may have actually aspirated.   PAST MEDICAL HISTORY:  1. Hypertension.  2. Gastroesophageal reflux disease.  3. Hyperlipidemia.  4. Hypothyroidism. 5. Conjunctivitis. 6. Arthritis.   ALLERGIES: No known drug allergies.   PAST SURGICAL HISTORY: Unknown.   MEDICATIONS: Medications are reviewed on the electronic medical record.   SOCIAL HISTORY: She has a negative history of tobacco and alcohol, according to the chart.   FAMILY HISTORY: Unknown.   REVIEW OF SYSTEMS: She is not able to provide.    PHYSICAL EXAMINATION:   GENERAL: At the time that she was seen, she was on the ventilator, orally intubated and sedated.   VITAL SIGNS: Temperature 99, pulse about 80, respiratory rate 15, blood pressure 111/31 which is now 86/49, and saturation 99%.   NECK: Neck appeared to be supple. There was no JVD, adenopathy, or thyromegaly.  EYES:  Extraocular movements could not be assessed.   MOUTH: She was orally intubated.   CHEST: Good air entry. No rales or rhonchi. Expansion was equal.    CARDIOVASCULAR: S1 and S2 is normal. Regular rhythm. No gallop or rub.   ABDOMEN: Soft and benign.   NEUROLOGIC: Unresponsive.   EXTREMITIES: No cyanosis or clubbing. Pulses equal.   SKIN: No acute rashes.   MUSCULOSKELETAL: No active synovitis.  LABORATORY, DIAGNOSTIC AND RADIOLOGIC DATA: CT scan of the head was negative for any acute changes.   The patient's white count was 5.7, hemoglobin 12.4, and hematocrit 36.4. Chemistries showed a plasma ammonia level of 36. Valproic was low at 41. Total CPK was 390 with a MB of 4.9. Other chemistry studies showed a BUN of 16, creatinine 0.93, sodium 144, potassium 4.7, and serum CO2 was 41.   Blood cultures have been sent, no growth as of yet.    IMPRESSION: Acute respiratory failure with metabolic alkalosis, likely related to underlying diarrhea. Her initial pH when she came in was 7.7 with a pCO2 of 24. Current pH is 7.5 with a pCO2 of 44. 1. Acute respiratory failure. She will be continued on full vent support. I have changed her to SIMV, dropped the rate down to 10 and pressure support of 10. We will repeat an ABG in about two hours, titrate FiO2 as needed and sedate as needed.  2. Alkalosis. Likely related to her vomiting and diarrhea issues that were going on prior to admission. She will need hydration. We will place her on fluids. I instructed the nursing staff now to do that. She will have repeat lab work monitored. 3. Possible aspiration. Monitor chest x-ray.  The initial chest film was not too remarkable. The patient will be on antibiotics. She has been started on Zosyn to cover broad coverage for possibility of aspiration.  4. Hypotension. We will titrate with fluids. We will use pressors if warranted.   We will continue with supportive care and follow. The patient's prognosis remains quite guarded.  ____________________________ Yevonne Pax, MD sak:slb D: 01/13/2012 09:54:11 ET T: 01/13/2012 10:15:33  ET JOB#: [id number]  cc: Yevonne Pax, MD, <Dictator> Yevonne Pax MD ELECTRONICALLY SIGNED 01/14/2012 12:40

## 2014-09-01 NOTE — Consult Note (Signed)
PATIENT NAME:  Cristine PolioCORBETT, Gilbert A MR#:  [id number] DATE OF BIRTH:  1945-06-30  DATE OF CONSULTATION:  01/16/2012  REFERRING PHYSICIAN:  Dr. Elpidio AnisSudini CONSULTING PHYSICIAN:  Bonnie Roig R. Sherrlyn HockPandit, MD  REASON FOR CONSULTATION: Thrombocytopenia.   HISTORY OF PRESENT ILLNESS: Patient is a 69 year old female with past medical history significant for multiple medical problems including profound mental retardation, type 2 diabetes, hypertension, gastroesophageal reflux disease, dyslipidemia, history of conjunctivitis, anxiety, hypothyroidism, arthritis who has been admitted to hospital on 08/31 with complaints of respiratory failure. Patient apparently was found to be very lethargic prior to admission, hypothermic upon presentation with rectal temperature of 89 degrees in ER. Patient has history of left lower extremity wound which was recently treated with Bactroban and Cefuroxime, reportedly wound has not improved much. She also has had some diarrhea according to caregiver. Patient unable to provide history since she is intubated on mechanical ventilation. She is found to have low platelet count since admission. CBC on 08/30 evening showed a platelet count of 36, hemoglobin 11.6, WBC 5900. Subsequent platelet count was 35 on 08/31, 40 on 09/01, and is slightly low at 33 today. Hemoglobin now is 9.7 today with WBC 6200, ANC 5200. Clinically patient does not seem to have bleeding symptoms, caretaker at bedside denies patient having any recent bleeding issues prior to admission also.   PAST MEDICAL HISTORY/PAST SURGICAL HISTORY: As in history of present illness above.   FAMILY HISTORY: Not available.   SOCIAL HISTORY: Per chart record, stays in a group home with multiple caregivers and history of tobacco use and alcohol use.   ALLERGIES: Per chart record no known drug allergies.   MEDICATIONS PRIOR TO ADMISSION: 1. Atorvastatin 10 mg daily.  2. Cetirizine 10 mg daily.  3. Anusol-HC 25 mg rectal suppository  t.i.d.  4. Ativan 1 mg p.r.n.  5. Albuterol nebulizer q.4-6 hours p.r.n.  6. Depakote 500 mg b.i.d.  7. Desenex foot 2% topical Fanapt 2 mg tablet b.i.d.  8. Fleet Enema p.r.n.  9. Ibuprofen 400 mg q.4 hours p.r.n.  10. Latanoprost 0.005% ophthalmic solution each eye as needed.  11. Levothyroxine 50 mcg daily.  12. Metoclopramide 2 tsp every 30 minutes before bedtime.  13. Multivitamin 1 tablet daily.  14. Namenda 5 mg daily.  15. Nitrofurantoin 100 mg daily. 16. Protonix 40 mg daily.  17. Trazodone 100 mg daily. 18. Vitamin C 500 mg b.i.d.   REVIEW OF SYSTEMS: Unable to obtain. Patient on mechanical ventilation. Otherwise details as in history of present illness above.   PHYSICAL EXAMINATION:  GENERAL: Patient is resting, on mechanical ventilation, moderately built and nourished individual. No icterus. Mild pallor.   VITAL SIGNS: Temperature 96.6, pulse 60, respiratory rate 10, blood pressure 110/36, 96% on mechanical ventilation.   HEENT: Normocephalic, atraumatic. Eyes slightly open, anicteric. No obvious bleeding around ET tube or in mouth.   NECK: Negative for lymphadenopathy.   CARDIOVASCULAR: S1, S2, regular rate and rhythm.    LUNGS: Lungs show bilateral diminished breath sounds overall. No rhonchi.   ABDOMEN: Soft. No hepatosplenomegaly clinically.   EXTREMITIES: Trace edema. No major bruising.   LYMPHATICS: No palpable adenopathy in the axillary or inguinal areas.   NEUROLOGIC: Patient is sedated.   MUSCULOSKELETAL: No obvious joint deformity or swelling.   SKIN: No generalized rashes, major bruising or ecchymosis.   LABORATORY, DIAGNOSTIC, AND RADIOLOGICAL DATA: Creatinine 0.94, potassium 3.7, calcium 8.9, WBC 6200, hemoglobin 9.7, platelets 33,000, ANC 5200. Blood culture negative so far. Chest x-ray showed hypoinflation, left  subclavian central line present.   IMPRESSION AND RECOMMENDATIONS: 69 year old female patient with history of mental retardation along  with multiple other medical problems on multiple medications including Depakote which could be associated with thrombocytopenia, admitted on 08/31 with respiratory failure, hypothermia, hypotension and felt to have acute respiratory failure due to aspiration pneumonia and possible septic shock. Blood cultures negative so far. Patient has significant thrombocytopenia with platelet count in the 30s since admission, no obvious major bleeding symptoms clinically at this time. Possible etiologies for thrombocytopenia could be multifactorial including ongoing issues with infection with possible sepsis with or without DIC, medication-induced (has been on Depakote), versus other etiology like idiopathic thrombocytopenia purpura. Review of peripheral smear does not reveal schistocytes, will check LDH and haptoglobin to rule out possibility of TTP. Patient does not seem to have fevers or renal insufficiency at this time. Also, will get further work-up including iron study, B12 and folate level, direct platelet antibody test, heparin-induced platelet antibody test, HBsAg and HCV antibody. If thrombocytopenia worsens, recommend considering changing antibiotics from Zosyn to other since it has been reported to cause thrombocytopenia also. Otherwise, continue supportive care along with monitoring for bleeding symptoms, recommend considering platelet transfusion if count drops below 20,000 or at higher counts if she has bleeding issues. Will continue to follow.   Thank you for the referral. Please feel free to contact me if any additional questions.  ____________________________ Maren Reamer Sherrlyn Hock, MD srp:cms D: 01/16/2012 23:28:36 ET T: 01/17/2012 06:37:47 ET  JOB#: [id number] cc: Darryll Capers R. Sherrlyn Hock, MD, <Dictator> Wille Celeste MD ELECTRONICALLY SIGNED 01/17/2012 21:44

## 2014-09-01 NOTE — Op Note (Signed)
PATIENT NAME:  Shannon [name], Shannon [name] MR#:  [id number] DATE OF BIRTH:  05/29/1945  DATE OF PROCEDURE:  01/13/2012  PREOPERATIVE DIAGNOSES:  1. Respiratory failure. 2. Hypotension. 3. Respiratory cardiopulmonary shock.   POSTOPERATIVE DIAGNOSES:    1. Respiratory failure. 2. Hypotension. 3. Respiratory cardiopulmonary shock.   PROCEDURE PERFORMED: Insertion of left subclavian line.   PROCEDURE PERFORMED BY: Renford DillsGregory G. Milani Lowenstein, M.D.   DESCRIPTION OF PROCEDURE: The patient is in the Intensive Care Unit. She is obtunded, intubated and ventilated. She is requiring multiple parenteral medications for sustaining her life. Her pulmonary critical care physician is requesting an emergent central line placed.   DESCRIPTION OF PROCEDURE: The patient is positioned in Trendelenburg. Left neck and chest wall are prepped and draped in [name] sterile fashion. Ultrasound is placed in [name] sterile sleeve. Ultrasound is utilized secondary to lack of appropriate landmarks to avoid vascular injury. Under direct ultrasound visualization, jugular vein is identified. It is noted to be quite small particularly since the patient is positioned in significant Trendelenburg. Attempts at accessing the jugular vein are successful, but the wire will not thread and therefore, the procedure is converted to subclavian. 1% lidocaine is infiltrated in the soft tissues and [name] Seldinger needle is used to access the subclavian vein without difficulty. Wire is then advanced. Dilator is passed over the wire and triple-lumen catheter is fed. All three lumens aspirate and flush easily and the catheter is secured to the skin of the chest wall with 2-0 silk. Sterile dressing is applied. Chest x-ray demonstrates catheter tip in good position. No hemopneumothorax.  ____________________________ Renford DillsGregory G. Jatavious Peppard, MD ggs:ap D: 01/13/2012 14:42:34 ET               T: 01/13/2012 14:51:25 ET              JOB#: [id number] cc: Renford DillsGregory G. Cady Hafen, MD,  <Dictator> Renford DillsGREGORY G Krissa Utke MD ELECTRONICALLY SIGNED 01/19/2012 16:24

## 2014-09-01 NOTE — H&P (Signed)
PATIENT NAME:  Shannon [name], Shannon [name] MR#:  [id number] DATE OF BIRTH:  [birth year]  DATE OF ADMISSION:  01/13/2012  PRIMARY CARE PHYSICIAN: Dr. Maryjane Hurter.   EMERGENCY ROOM PHYSICIAN: Dr. Daphine Deutscher.  CHIEF COMPLAINT: Respiratory failure.   SUBJECTIVE: This is a 69 year old female with history of mental retardation, nonverbal at baseline, who presented today after being found lethargic. The patient has also been somewhat hypothermic and was found to have a rectal temperature of 89 degrees Fahrenheit upon presentation to the ER. The patient has been seeing Dr. Hassan Rowan in the wound care clinic for left lower extremity also which was recently treated with Bactroban and cefuroxime. However, according to the caregiver who provides most of the history, the patient has not really improved significantly as far as the toe infection is concerned. She has also had diarrhea for the last 3 or 4 days and this morning the patient was complaining of nausea and also vomited and potentially aspirated. In the ER, the patient is found to be hypercapnic but compensated on her ABG. She is also found to have low platelet count of 36 which is new for her.   PAST MEDICAL HISTORY:    1. Diabetes type 2.  2. Hypertension.  3. Gastroesophageal reflux disease. 4. Profound mental retardation.  5. Dyslipidemia.  6. History of conjunctivitis. 7. Anxiety.  8. Hypothyroidism.  9. Arthritis.   ALLERGIES: No known drug allergies.   PAST SURGICAL HISTORY: None.   CURRENT MEDICATIONS:  1. Albuterol nebulizer q. 4 to 6 hours as needed.  2. Anusol-HC 25 mg rectal suppository 3 times a day.  3. Ativan 1 mg as needed.  4. Atorvastatin 10 mg p.o. daily.  5. Cetirizine 10 mg p.o. daily.  6. Depakote 500 mg p.o. twice a day.  7. Desenex Foot 2% topical. 8. Fanapt 2 mg oral tablets 1 tablet 2 times a day.  9. Fleet Enema as needed. 10. Ibuprofen 400 mg every four hours as needed. 11. Latanoprost 0.005% ophthalmic solution in each  eye as needed.  12. Levothyroxine 50 mcg p.o. daily.  13. Metoclopramide solution 2 tsp every 30 minutes before bedtime.  14. Mucinex DM 30/600.  15. Multivitamin 1 tablet p.o. daily.  16. Namenda 5 mg p.o. daily.  17. Nitrofurantoin 100 mg p.o. daily.  18. Protonix 40 mg p.o. daily.  19. Trazodone 100 mg p.o. daily.  20. Vitamin C 500 mg twice a day.   SOCIAL HISTORY: The patient stays in a group home. She has multiple caregivers and a history of tobacco use, alcohol use, history of profound mental retardation.   FAMILY HISTORY: Unable to obtain because of the patient's profound mental retardation.   REVIEW OF SYSTEMS: CONSTITUTIONAL: Low-grade temperature upon presentation. Positive for fatigue, weakness. No weight loss or weight gain. EYES: No blurry vision, double vision, pain, redness, inflammation, glaucoma, or cataracts. ENT: No tinnitus, ear pain, hearing loss, seasonal allergies, or epistaxis. RESPIRATORY: No cough, wheezing, hemoptysis, dyspnea, asthma, or painful respiration. CARDIOVASCULAR: No chest pain, orthopnea, edema, arrhythmia, or palpitations. GASTROINTESTINAL: No nausea, vomiting, diarrhea, or abdominal pain. GENITOURINARY: No dysuria, hematuria, renal calculi or frequency. ENDOCRINE: No polyuria, nocturia, thyroid problems, increased sweating, heat or cold intolerance. INTEGUMENTARY: No acne, rash, change in mole or hair. MUSCULOSKELETAL: No pain in the neck, back, shoulder arthritis. NEUROLOGIC: No numbness, weakness, dysarthria, epilepsy, tremor, vertigo, or ataxia. PSYCHIATRIC: No anxiety, insomnia, bipolar disorder, schizophrenia, nervousness.   PHYSICAL EXAMINATION:  VITAL SIGNS: Blood pressure 127/84, heart rate 98, temperature 89 degrees Fahrenheit  rectally.   HEENT: Atraumatic, normocephalic. Pupils equal and reactive to light. Extraocular movements are intact. Sclerae anicteric.   NECK: Supple. No JVD. No organomegaly. No carotid bruit.   CARDIOVASCULAR: Regular  rate and rhythm. No murmurs, rubs, or gallops.   RESPIRATORY: Lungs clear to auscultation bilaterally. No wheezes, crackles or rhonchi.   ABDOMEN: Soft, nontender, nondistended, normoactive bowel sounds. No hepatosplenomegaly.   GENITOURINARY: No hematuria or masses noted.   SKIN: Without any skin lesions.   ENDOCRINE: No masses. No thyromegaly.   LYMPHATIC: No inguinal or cervical lymphadenopathy.   NEUROLOGIC: Cranial nerves II through XII attempted to be tested but the patient is currently intubated and not cooperative and sedated.   MUSCULOSKELETAL: No arthritis or joint effusion. She does have an ulcerative lesion on the left toe.   HEMATOLOGIC: No ecchymosis or bleeding.   LABORATORY, DIAGNOSTIC AND RADIOLOGICAL DATA: Glucose 76, BUN 16, creatinine 0.193, sodium 144, potassium 4.7, ALT 40, AST 30, alkaline phosphatase 126. WBC 5.9, hemoglobin 11.6, hematocrit 35.0, platelet count 36. INR 0.9. Troponin less than 0.02. BNP 57, valproic acid 57, TSH 5.52. ABG shows a pH of 7.38, pCO2 70 and 02 76. Urinalysis shows negative for nitrite and leukocyte esterase.   ASSESSMENT AND PLAN:  1. Acute respiratory failure, likely in the setting of aspiration pneumonia. The patient had diarrhea for the last 3 or 4 days associated with some nausea and vomiting, likely viral gastroenteritis. She had a colonoscopy on the 20th that showed polyps, but no obvious colonic lesion. The patient has been intubated and we will continue to attempt to extubate her, possibly tomorrow. At this time, the patient is awake but not combative. Therefore, will hold off on excessive sedation tonight. We will use PSV mode with a PEEP of 6 and a back-up rate of 12.  2. Hypothermia, likely in the setting of sepsis. Most likely the source is of pulmonary origin and aspiration pneumonia. I doubt that the patient is septic because of her toe infection. We will obtain plain films to see if the patient has underlying osteomyelitis.  Wound care consultation will also be obtained.  3. Baseline mental retardation. The patient is currently awake, but not communicative, which is her baseline.  4. Thrombocytopenia, could be secondary to DIC versus a side effect of her multiple medications. A pharmacy consultation has been ordered to review her medications.  5. Gastroesophageal reflux disease. Will start the patient on IV PPI.  6. History of mental retardation. Will continue Depakote and Depakote level is therapeutic. We will also check an ammonia level.  7. At this point she is a FULL CODE.    TIME SPENT:  70 minutes. ____________________________ Richarda OverlieNayana Roye Gustafson, MD na:ap D: 01/13/2012 00:39:32 ET T: 01/13/2012 08:29:05 ET JOB#: [id number]  cc: Richarda OverlieNayana Merrillyn Ackerley, MD, <Dictator> Marina Goodellale E. Feldpausch, MD Richarda OverlieNAYANA Fujiko Picazo MD ELECTRONICALLY SIGNED 01/21/2012 21:27

## 2014-09-01 NOTE — Consult Note (Signed)
HEMATOLOGY followup - still intubated, sedated. No obvious bleeding issues.no feversedated, on mech ventilation. NAD          vitals - 98.9, 92, 18, 134/59, 98%           lungs - b/l diminished BS          skin - no major bruising or petechiae Hb 9.3, WBC 6500, platelets 61K, ANC 5200.  LDH normal at 208. Iron study, folate, LDH, PT, PTT, FDP unremarkable.  Serum platelet antibody panel mildly positive.  69 year old female patient with history of mental retardation along with multiple other medical problems admitted on 08/31 with respiratory failure, hypothermia, hypotension and felt to have acute respiratory failure due to aspiration pneumonia and possible septic shock. Also with significant thrombocytopenia with platelet count in the 30s since admission, no obvious major bleeding symptoms clinically at this time. Possible etiologies for thrombocytopenia could be multifactorial including ongoing issues with infection with possible sepsis, medication-induced (has been on Depakote) versus other etiology like idiopathic thrombocytopenia purpura. Serum platelet antibody panel mildly positive, but unclear if any clinical significance. Haptoglobin, B12 level, heparin-induced platelet antibody test, HBsAg and HCV antibody are pending.  Platelet count seems to be improving and is up to 61K today.  Recommend continuing supportive care along with monitoring for bleeding symptoms, recommend considering platelet transfusion if count drops below 20,000 or at higher counts if she has bleeding issues. Will continue to follow intermittently.  Electronic Signatures: Izola PricePandit, Exavior Kimmons Raj (MD)  (Signed on 05-Sep-13 22:03)  Authored  Last Updated: 05-Sep-13 22:03 by Izola PricePandit, Jeremiah Tarpley Raj (MD)

## 2014-09-01 NOTE — Consult Note (Signed)
HEMATOLOGY followup note - still intubated, sedated. No obvious bleeding issues.no feversedated, on mech ventilation. NAD          vitals - 97.3, 94, 14, 148/43          lungs - b/l diminished BS          skin - no major bruising or petechiaeHb 9.4, WBC 7000, platelets 32K, ANC 5700. Iron study, folate, LDH, PT, PTT, FDP unremarkable.  69 year old female patient with history of mental retardation along with multiple other medical problems admitted on 08/31 with respiratory failure, hypothermia, hypotension and felt to have acute respiratory failure due to aspiration pneumonia and possible septic shock. Blood cultures negative so far. Patient has significant thrombocytopenia with platelet count in the 30s since admission, no obvious major bleeding symptoms clinically at this time. Possible etiologies for thrombocytopenia could be multifactorial including ongoing issues with infection with possible sepsis, medication-induced (has been on Depakote) versus other etiology like idiopathic thrombocytopenia purpura. Review of peripheral smear does not reveal schistocytes, LDH is normal. Haptoglobin, B12 level, direct platelet antibody test, heparin-induced platelet antibody test, HBsAg and HCV antibody are pending.  If thrombocytopenia worsens, recommend considering changing antibiotics from Zosyn to other since it has been reported to cause thrombocytopenia also. Otherwise, continue supportive care along with monitoring for bleeding symptoms, recommend considering platelet transfusion if count drops below 20,000 or at higher counts if she has bleeding issues. Will continue to follow.  Electronic Signatures: Izola PricePandit, Uno Esau Raj (MD)  (Signed on 05-Sep-13 00:26)  Authored  Last Updated: 05-Sep-13 00:26 by Izola PricePandit, Casanova Schurman Raj (MD)
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
## 2014-09-01 NOTE — Consult Note (Signed)
General Aspect hypotension lack of iv access    Present Illness The patient is a 69 year old female with history of mental retardation, nonverbal at baseline, who was brought to the ER after being found lethargic. The patient was also hypothermic with a rectal temperature of 89 degrees Fahrenheit.  She has had diarrhea for the last 3 or 4 days and this morning the patient was complaining of nausea and also vomited and potentially aspirated. In the ER, the patient is found to be hypercapnic but compensated on her ABG. She is also found to have low platelet count of 36 which is new for her. She is also noted to be hypotensive and is admitted to teh ICU.  Subsequently she has required intubation.  PAST MEDICAL HISTORY:    1. Diabetes type 2.  2. Hypertension.  3. Gastroesophageal reflux disease. 4. Profound mental retardation.  5. Dyslipidemia.  6. History of conjunctivitis. 7. Anxiety.  8. Hypothyroidism.  9. Arthritis.   Home Medications: Medication Instructions Status  atorvastatin 10 mg oral tablet 1 tab(s) orally once a day (at bedtime) Active  Lac-Hydrin 12% topical lotion 1 application topically 2 times a day to feet Active  Vitamin C 500 mg oral tablet 1 tab(s) orally 2 times a day Active  Senokot 2 tab(s) orally once a day (at bedtime) Active  Depakote ER 500 mg oral tablet, extended release 1 tab(s) orally 2 times a day Active  Protonix 40 mg oral delayed release tablet 1 tab(s) orally once a day 45 minutes before lunch. Active  metoclopramide 5 mg/5 mL oral syrup 2 teaspoonsful (10 milliliters) orally once a day 30 minutes before bedtime. Active  trazodone 100 mg oral tablet 1 tab(s) orally once a day (at bedtime) Active  Namenda 5 mg oral tablet 1 tab(s) orally 2 times a day Active  Fanapt 2 mg oral tablet 1 tab(s) orally 2 times a day Active  nitrofurantoin macrocrystals 100 mg oral capsule 1 cap(s) orally once a day (in the morning) Active  latanoprost 0.005% ophthalmic  solution 1 drop(s) into each eye once a day (at bedtime). Active  levothyroxine 50 mcg (0.05 mg) oral tablet 1 tab(s) orally once a day on an empty stomach with water. *wait 30 minutes* Active  Ativan 1 mg oral tablet 1 tab(s) orally 1 hour prior to invasive medical procedures. Active  Fleet Enema 1 enema rectal once a day as needed for constipation  Active  Debrox 6.5% otic solution 5 to 10 drop(s) in each ear 2 times a month. Active  multivitamin 1 tab(s) orally once a day Active  cetirizine 10 mg oral tablet 1 tab(s) orally once a day as needed for allergy symptoms. Active  Mucinex DM 30 mg-600 mg oral tablet, extended release 1 tab(s) orally every 12 hours as needed for chest congestion/cough. Active  Moisturel topical lotion Apply topically to affected area once a day after bath as directed. Active  Desenex Foot 2% topical powder Apply topically to feet once a day (in the morning) Active  Anusol-HC 25 mg rectal suppository 1 suppository(ies) rectal 3 times a day as needed for constipation.  Active  ibuprofen 400 mg oral tablet 1 tab(s) orally every 4 hours as needed for pain/fever. Active  Phenergan 1 tab (25 milligrams) orally every 6 hours as needed for Nausea, Vomiting.  Active  albuterol 2.5 mg/3 mL (0.083%) inhalation solution 1 vial (3 milliliters) via nebulizer every 4 to 6 hours as needed for wheezing.  Active  No Known Allergies:   Case History:   Family History Non-Contributory    Social History negative tobacco, negative ETOH, negative Illicit drugs   Review of Systems:   ROS Pt not able to provide ROS   Physical Exam:   GEN well developed, obese, critically ill appearing    HEENT dry oral mucosa, orally intubated    NECK supple  trachea midline    RESP intubated ventilated    CARD regular rate  no JVD    ABD soft  nondistended    GU foley catheter in place  clear yellow urine draining    EXTR negative cyanosis/clubbing, negative edema    SKIN No rashes,  skin turgor poor    PSYCH sedated, intubated   Nursing/Ancillary Notes: **Vital Signs.:   31-Aug-13 07:00   Temperature Temperature (F) 99   Celsius 37.2   Temperature Source rectal   Pulse Pulse 80   Respirations Respirations 15   Systolic BP Systolic BP 111   Diastolic BP (mmHg) Diastolic BP (mmHg) 31   Mean BP 57   Pulse Ox % Pulse Ox % 96   Pulse Ox Activity Level  At rest   Oxygen Delivery Ventilator Assisted   Pulse Ox Heart Rate 80   TDMs:  31-Aug-13 00:23    Valproic Acid, Serum  41 (50-100 POTENTIALLY TOXIC:  > 200 mcg/mL)  Lab:  31-Aug-13 04:20    pH (ABG)  7.70   PCO2  24   PO2  59   FiO2 30   Base Excess  12.5   HCO3  32.1   O2 Saturation 96.9   O2 Device 840   Specimen Site (ABG) RT RADIAL   Specimen Type (ABG) ARTERIAL   Patient Temp (ABG) 35.1   Mode ASSIST CONTROL   Vt 500   PEEP 5.0   Mechanical Rate 18    07:30    pH (ABG)  7.50   PCO2 44   PO2 95   Base Excess  9.9   HCO3  34.3   O2 Saturation 98   Specimen Site (ABG) LT RADIAL   Specimen Type (ABG) ARTERIAL   Patient Temp (ABG) 37.0   Vt 350   PEEP 5.0   Mechanical Rate 15 (Result(s) reported on 13 Jan 2012 at 09:51AM.)   %FiO2 30.0    12:30    pH (ABG) 7.41   PCO2  53   PO2  79   FiO2 30   Base Excess  7.3   HCO3  33.6   O2 Saturation 95.7   O2 Device 840   Specimen Site (ABG) LT BRACHIAL   Specimen Type (ABG) ARTERIAL   Patient Temp (ABG) 37.0   Mode SIMV   Vt 350   PSV 10   PEEP 5.0   Mechanical Rate 10 (Result(s) reported on 13 Jan 2012 at 12:53PM.)  Routine Chem:  31-Aug-13 01:57    Ammonia, Plasma  36 (Result(s) reported on 13 Jan 2012 at 02:58AM.)    04:20    Result Comment - HAND DELIVERED  - dr. Susie Cassette 0430 01/13/2012  - NOTIFIED OF CRITICAL VALUE  Result(s) reported on 13 Jan 2012 at 04:34AM.    09:16    Result Comment CK.TOTAL - Slight hemolysis, interpret results with  - caution.  Result(s) reported on 13 Jan 2012 at 10:05AM.  Cardiac:  31-Aug-13  01:57    Troponin I < 0.02 (0.00-0.05 0.05 ng/mL or less: NEGATIVE  Repeat testing in 3-6 hrs  if  clinically indicated. >0.05 ng/mL: POTENTIAL  MYOCARDIAL INJURY. Repeat  testing in 3-6 hrs if  clinically indicated. NOTE: An increase or decrease  of 30% or more on serial  testing suggests a  clinically important change)   CK, Total  390   CPK-MB, Serum  4.9 (Result(s) reported on 13 Jan 2012 at 03:07AM.)    09:16    Troponin I < 0.02 (0.00-0.05 0.05 ng/mL or less: NEGATIVE  Repeat testing in 3-6 hrs  if clinically indicated. >0.05 ng/mL: POTENTIAL  MYOCARDIAL INJURY. Repeat  testing in 3-6 hrs if  clinically indicated. NOTE: An increase or decrease  of 30% or more on serial  testing suggests a  clinically important change)   CK, Total  392   CPK-MB, Serum 3.1  Routine Hem:  31-Aug-13 00:23    WBC (CBC) 5.7   RBC (CBC)  3.69   Hemoglobin (CBC) 12.4   Hematocrit (CBC) 36.4   Platelet Count (CBC)  35   MCV 99   MCH 33.5   MCHC 34.0   RDW  15.5   Neutrophil % 59.3   Lymphocyte % 36.5   Monocyte % 3.6   Eosinophil % 0.1   Basophil % 0.5   Neutrophil # 3.4   Lymphocyte # 2.1   Monocyte # 0.2   Eosinophil # 0.0   Basophil # 0.0 (Result(s) reported on 13 Jan 2012 at 12:43AM.)     Impression 1.  Hypotension          patient has inadequate access and is repuiring multiple parenteral medications to sustain her life.          she will have a central line placed with ultrasound 2.  Respiratory failure         patient is intubated and on ventilator support         pulmonary following 3.   Hypertension         hold antihypertensive medications 4.   GERD         continue PPI    Plan level 3   Electronic Signatures: Levora DredgeSchnier, Martavious Hartel (MD)  (Signed 31-Aug-13 17:11)  Authored: General Aspect/Present Illness, Home Medications, Allergies, History and Physical Exam, Vital Signs, Labs, Impression/Plan   Last Updated: 31-Aug-13 17:11 by Levora DredgeSchnier, Autumn Pruitt (MD)

## 2014-09-06 NOTE — Discharge Summary (Signed)
PATIENT NAME:  Shannon [name], MATAYA MR#:  [id number] DATE OF BIRTH:  [birth year]  DATE OF ADMISSION:  05/30/2011 DATE OF DISCHARGE:  06/01/2011  ADMISSION DIAGNOSIS: Sepsis.   DISCHARGE DIAGNOSES:  1. Escherichia coli sepsis.  2. Acute pyelonephritis, Escherichia coli.  3. Acute renal failure, resolved on intravenous fluids.  4. Dehydration, resolved.  5. Hypotension due to sepsis, resolved.  6. Diabetes mellitus, diet controlled.  7. History of gastroesophageal reflux disease.  8. Hypertension.  9. Mental retardation.   DISCHARGE CONDITION: Stable.   DISCHARGE MEDICATIONS: The patient is to resume her outpatient medications which are:  1. Namenda 5 mg p.o. twice daily  2. Depakote ER 500 mg p.o. twice daily. 3. Protonix 40 mg p.o. at lunchtime. 4. Multivitamins once daily.  5. Senokot daily. 6. Albuterol 0.83% small-volume nebulizer every 4 to 6 hours as needed.  7. Atorvastatin 10 mg p.o. at bedtime.  8. Reglan 5 mg p.o. before bedtime.  9. Latanoprost 0.005% ophthalmic solution, 1 drop to each affected eye at bedtime.  10. Levothyroxine 50 mcg p.o. daily.  11. Ativan 1 mg 1 hour prior to invasive procedures as needed.  12. Fleet's enema once rectally as needed.  13. Debrox 6.5% otic solution, 1 drop to each affected ear twice a month.  14. Cetirizine 10 mg p.o. daily as needed.  15. Mucinex 600 mg p.o. every 12 hours as needed.  16. Motrin 400 mg every 4 hours as needed.  17. Phenergan 25 mg every 6 hours as needed.  18. Lac-Hydrin 12% topical lotion, 1 application topically twice daily to her feet.  19. Anusol suppositories rectally 3 times a day as needed.   ADDITIONAL MEDICATIONS:  1. Tylenol 650 mg p.o. every 4 hours as needed.  2. Zofran 4 mg every 6 hours as needed for nausea.  3. Trazodone 100 mg p.o. at bedtime.  4. Keflex 500 mg p.o. t.i.d. 3 times daily for 11 more days.  5. Norvasc 10 mg p.o. daily.  6. Seroquel 100 mg p.o. in the morning and 150 mg p.o. at  bedtime.   NOTE: The patient is not to take Demadex, HCTZ or losartan until recommended by primary care physician.   HOME OXYGEN: None.   DIET: 2 grams salt, 1800 calorie ADA, mechanical soft, low fat, low cholesterol diet.   PHYSICAL ACTIVITY LIMITATIONS: As tolerated.    FOLLOW-UP APPOINTMENT: Dr. Maudie Flakes two days after discharge.    CONSULTANTS: Care Management.   LABORATORY, DIAGNOSTIC AND RADIOLOGICAL DATA:  Chest portable single view 05/29/2011 revealed bibasilar opacities likely secondary to atelectasis and vascular crowding from low lung volumes.  Lab data done on 05/29/2011 showed a glucose of 134, BUN and creatinine were 24 and 2.25, otherwise unremarkable BMP. The patient's bicarbonate level was, however, elevated at 34, signifying dehydration. Estimated GFR for a non-African American would be 23. The patient's lipase level was low at 63. Liver enzymes showed albumin level of 3.0.  Troponin level less than at 0.02.  White blood cell count was markedly elevated to 19.3, hemoglobin was 12.7, platelets 123. Blood cultures taken on 05/29/2011 grew Escherichia coli.  Urinalysis showed amber cloudy urine, negative for glucose, bilirubin or ketones, specific gravity was 1.014, pH was 5.0, 1+ blood, 100 mg/dL protein, negative for nitrites, 3+ leukocyte esterase were noted, 9 red blood cells, 252 white blood cells, 3+ bacteria was noted. Mucous cells as well as white blood cell clumps were present.  Urine culture showed the same Escherichia coli. Escherichia coli  was sensitive to trimethoprim/sulfamethoxazole, nitrofurantoin as well as cefazolin, also ceftriaxone intermittently to ampicillin/sulbactam, resistant to ampicillin, also sensitive to ciprofloxacin, ceftazidime, imipenem, levofloxacin as well as cefepime.   HISTORY AND PHYSICAL: The patient is a 69 year old Caucasian female with history of mental retardation, also a history of hypertension, diabetes, hyperlipidemia, who  presented to the hospital with nausea and fevers. Her initial blood pressure was found to be 80s/40s. She was also noted to have leukocytes in her urine and acute renal failure. Please refer to Dr. Eliane DecreePatel's admission note on 05/30/2011. On arrival to the Emergency Room, as mentioned above, the patient's temperature was 98.5. Her heart rate was 84, respiratory rate was 22, blood pressure initially was 80/40. Later on with IV fluids, it was 139/60s. Oxygen saturation was 95% on room air. Physical exam was unremarkable.   HOSPITAL COURSE: The patient was admitted to the hospital. She was started on Rocephin IV as well as high rate IV fluids. She was rehydrated as mentioned above, and her kidney function improved.   In regards to sepsis: It was felt as the patient's sepsis was Escherichia coli consistent with the same bacteria growing from blood as well as urine cultures, that it was coming from acute pyelonephritis. The patient was continued on Rocephin initially. Whenever she was stable enough and the white blood cell count was normal, she was transitioned to Keflex. The patient is to continue antibiotic therapy for 11 more days to complete a 14-day course.   In regards to acute pyelonephritis: As mentioned above, the patient was continued on antibiotic therapy. Escherichia coli was cultured from urine, and it was sensitive to Rocephin. The patient was transitioned to Keflex. The patient is to take antibiotic therapy for 11 more days to complete the course.   In regards to acute renal failure: As mentioned above, the patient's creatinine was high when she first came into the hospital; however, her kidney function improved, and on the day of discharge, 06/01/2011, the patient's creatinine of 0.93 with BUN of 8. The patient had ultrasound of her kidneys done on 05/30/2011, which was unremarkable. No hydronephrosis was noted. The patient's dehydration, again as mentioned above, resolved with IV fluid  administration.   Initially the patient was hypotensive, however, the patient's blood pressure improved with IV fluid administration. The patient's blood pressure medications, including diuretics, Demadex, as well as HCTZ and losartan were placed on hold and was not restarted by the day of discharge. On the day of discharge, the patient's vital signs are temperature 98, pulse 83, respiration rate 20, blood pressure 139/81, and saturation 94% on room air at rest.   In regards to diabetes mellitus: The patient is to continue diabetic diet as well as follow up with primary care physician for further recommendations. While in the hospital she was managed on sliding scale insulin. Because of unpredictable oral intake, she was not started on any medications. Her hemoglobin A1c was checked, which was found to be 7.0. It was felt that the patient's diabetes was under satisfactory control.   For gastroesophageal reflux disease, hypertension, as well as mental retardation, the patient is to continue her outpatient medications. The patient is being discharged in stable condition with the above-mentioned medications and follow-up.   TIME SPENT:   40 minutes.   ____________________________ Katharina Caperima Shellby Schlink, MD [id number] D: 06/01/2011 18:56:48 ET T: 06/02/2011 09:11:00 ET JOB#: [id number]  cc: Katharina Caperima Cosette Prindle, MD, <Dictator> Marina Goodellale E. Feldpausch, MD    Katharina CaperIMA Morine Kohlman MD ELECTRONICALLY SIGNED 06/12/2011 7:49

## 2014-09-06 NOTE — H&P (Signed)
PATIENT NAME:  Shannon [name], Letonia A MR#:  [id number] DATE OF BIRTH:  May 15, 1946  DATE OF ADMISSION:  05/30/2011  PRIMARY CARE PHYSICIAN:  Dr. Maryjane HurterFeldpausch  EMERGENCY ROOM PHYSICIAN: Dr. Carollee MassedKaminski   CHIEF COMPLAINT: Nausea.   HISTORY OF PRESENT ILLNESS: The patient is a 69 year old female with history of hypertension and severe mental retardation who presents with chief complaint of weakness and severe nausea. The patient was referred due to fever. Her initial blood pressure was 82/42.  The patient is not able to communicate.  In the Emergency Room, the patient's urinalysis was positive with 252 WBCs, positive leukocyte esterase 3+. The patient was noted to be in acute renal failure. Creatinine is 2.25 GFR is 23.   PAST MEDICAL HISTORY:  1. Diabetes type 2.  2. Hypertension.  3. Gastroesophageal reflux disease.  4. Profound mental retardation.  5. Hyperlipidemia.  6. History of conjunctivitis. 7. Anxiety.  8. Hypothyroidism. 9. Arthritis.   ALLERGIES: No known drug allergies.   CURRENT MEDICATIONS:  1. Vitamin C 500 mg p.o. b.i.d.  2. Senokot 2 tablets p.o. nightly.  3. Losartan 50 mg p.o. in a.m.  4. Hydrochlorothiazide 25 mg p.o. daily.  5. Atorvastatin 10 mg p.o. nightly.  6. Demadex 10 mg p.o. daily.  7. Depakote ER 500 mg p.o. b.i.d.  8. Protonix 40 mg p.o. daily.  9. Reglan 5 mg/5 mL 2 tsp 30 minutes before bedtime. 10. Desyrel 100 mg, 1 p.o. nightly.  11. K-Dur 20 mEq p.o. b.i.d. 12. Namenda 5 mg p.o. b.i.d.  13. Fanapt 100 mg p.o. b.i.d. 14. Latanoprost one drop in each eye at night. 15. Levothyroxine 50 mcg p.o. daily.  16. Ativan 1 mg p.o. one hour prior to invasive medical procedures.  17. Fleets enema  p.r.n. for constipation.  18. Multivitamin p.o. daily.  19. Cetirizine 10 mg p.o. daily p.r.n. for allergy symptoms.  20. Mucinex 600/30, 1 p.o. q. 12 hours p.r.n. for chest congestion. 21. Moisturel lotion daily after bath as directed. 22. Lac-Hydrin 12% cream, apply  to feet twice daily.  23. Desenex powder, apply to feet in a.m. 24. Debrox 5 to 10 drops to each eye twice monthly. 25. Motrin 400 mg p.o. q. 4 hours p.r.n. 26. Phenergan 25 mg, 1 q. 4-6 hours p.r.n.  27. Albuterol 0.83 nebulizer q. 4-6 hours p.r.n. for wheezing.  28. Tylenol 500 mg p.o. b.i.d. for pain.   SOCIAL HISTORY: The patient is a resident of a skilled nursing facility. No history of tobacco abuse, alcohol abuse, or drug use. She has history of profound mental retardation.   FAMILY HISTORY: Unable to obtain.   REVIEW OF SYSTEMS:  Limited.  CONSTITUTIONAL: No chills or night sweats. HEENT: No hearing loss, dysphagia, visual problems, or sore throat. CARDIOVASCULAR: No chest pain, orthopnea, or paroxysmal nocturnal dyspnea.  RESPIRATORY: No cough, wheezing, or hemoptysis reported. GI: No nausea, vomiting, abdominal pain, hematemesis, hematochezia, or melena reported. GU: No hematuria, dysuria, or frequency.  NEURO: No headache or focal weakness or seizures. SKIN: No reports of lesions or rash. ENDOCRINE: No polyuria, polyphagia, or polydipsia. MUSCULOSKELETAL: No arthritis, joint effusion, or swelling.   PHYSICAL EXAMINATION:  VITAL SIGNS: Temperature 98.5, heart rate 84, respiratory rate 22, blood pressure 139/61, oxygen saturation 95%.   HEENT: Atraumatic, normocephalic. Pupils equal, round, reactive to light and accommodation.  Extraocular movements intact. Sclerae anicteric. Mucous membranes dry.   NECK: Supple. No organomegaly.   CARDIOVASCULAR: S1, S2. Regular rate and rhythm. No gallops. No thrills. No murmurs.  RESPIRATORY: Lungs are clear to auscultation. No rales, no rhonchi, no wheezes, no bronchial breath sounds.   GI: Abdomen is soft, nontender, nondistended. Normal bowel sounds. No hepatosplenomegaly.   GU: There is no hematuria or masses noted.   SKIN: No lesions, no rash.   ENDOCRINE: No masses, no thyromegaly.   LYMPH: No lymphadenopathy or nodes palpable.    NEUROLOGIC: Cranial nerves II through XII grossly intact. Motor strength is five out of five bilateral upper and lower extremities. Sensation within normal limits. No focal neurological deficit noted on examination.   MUSCULOSKELETAL: No arthritis, joint effusion, or swelling.   HEMATOLOGICAL: No ecchymosis, no bleeding, and no petechiae noted.   EXTREMITIES: No cyanosis, no clubbing, no edema. 2+ pedal pulses noted bilaterally.   LABORATORY DATA:  WBC count 19,300, hemoglobin 12.7, hematocrit 38, platelet count 123, BUN 24, creatinine 2.25. Sodium 141, potassium 3.8, chloride 99, CO2 34, calcium 9.1, alkaline phosphatase 52, ALT 17, AST 33, total protein 6.9, albumin 3. Estimated GFR 23. Troponin less than 0.02. Urinalysis is positive, 252 WBCs. Lactic acid level is 1.2.   ASSESSMENT AND PLAN:  1. The patient is a 69 year old female who presents with chief complaint of fever, nausea, weakness, urinary tract infection, sepsis, and significant leukocytosis. Admit the patient to the telemetry unit. Start the patient on IV Rocephin. Check urine cultures. IV fluids.  2. Acute renal failure due to prerenal azotemia. Hold lisinopril, HCTZ. Monitor renal functions closely. Will likely improve with IV hydration.  3. Diabetes. Accu-Cheks, insulin sliding scale.  4. Hypertension. Monitor closely. We are currently holding antihypertensive medications.  5. Hyperlipidemia. Continue atorvastatin.  6. Hypothyroidism. Continue levothyroxine. Check TSH.  7. Gastroesophageal reflux disease. Continue with PPI.  8. History of mental retardation. Continue Desyrel, Depakote. Check Depakote level.  9. Thrombocytopenia. Monitor platelet count closely.    ____________________________ Donia Ast, MD [id number] D: 05/30/2011 01:24:26 ET T: 05/30/2011 08:37:48 ET JOB#: [id number]  cc: Donia Ast, MD, <Dictator> Marina Goodell, MD Donia Ast MD ELECTRONICALLY SIGNED 05/30/2011 21:36

## 2014-09-15 ENCOUNTER — Other Ambulatory Visit
Admission: RE | Admit: 2014-09-15 | Discharge: 2014-09-15 | Disposition: A | Discharge status: Home / Self Care | Payer: Medicare Other | Source: Ambulatory Visit | Attending: Family Medicine | Admitting: Family Medicine

## 2014-09-15 DIAGNOSIS — R451 Restlessness and agitation: Secondary | ICD-10-CM | POA: Diagnosis present

## 2014-09-15 LAB — BASIC METABOLIC PANEL
ANION GAP: 7 (ref 5–15)
BUN: 23 mg/dL — ABNORMAL HIGH (ref 6–20)
CHLORIDE: 107 mmol/L (ref 101–111)
CO2: 28 mmol/L (ref 22–32)
Calcium: 9.1 mg/dL (ref 8.9–10.3)
Creatinine, Ser: 0.73 mg/dL (ref 0.44–1.00)
GFR calc Af Amer: >60 mL/min (ref >60)
GFR calc non Af Amer: >60 mL/min (ref >60)
Glucose, Bld: 131 mg/dL — ABNORMAL HIGH (ref 65–99)
POTASSIUM: 4.1 mmol/L (ref 3.5–5.1)
SODIUM: 142 mmol/L (ref 135–145)

## 2014-09-15 LAB — CBC
HCT: 41.2 % (ref 35.0–47.0)
HEMOGLOBIN: 13.5 g/dL (ref 12.0–16.0)
MCH: 30.1 pg (ref 26.0–34.0)
MCHC: 32.7 g/dL (ref 32.0–36.0)
MCV: 92.1 fL (ref 80.0–100.0)
PLATELETS: 178 10*3/uL (ref 150–440)
RBC: 4.48 MIL/uL (ref 3.80–5.20)
RDW: 13.5 % (ref 11.5–14.5)
WBC: 6.6 10*3/uL (ref 3.6–11.0)

## 2014-12-02 ENCOUNTER — Other Ambulatory Visit
Admission: RE | Admit: 2014-12-02 | Discharge: 2014-12-02 | Disposition: A | Discharge status: Home / Self Care | Payer: Medicare Other | Source: Other Acute Inpatient Hospital | Attending: Nurse Practitioner | Admitting: Nurse Practitioner

## 2014-12-02 DIAGNOSIS — R4182 Altered mental status, unspecified: Secondary | ICD-10-CM | POA: Diagnosis present

## 2014-12-02 LAB — URINALYSIS COMPLETE WITH MICROSCOPIC (ARMC ONLY)
Bacteria, UA: NONE SEEN
Bilirubin Urine: NEGATIVE
Glucose, UA: NEGATIVE mg/dL
HGB URINE DIPSTICK: NEGATIVE
Ketones, ur: NEGATIVE mg/dL
Leukocytes, UA: NEGATIVE
Nitrite: NEGATIVE
PH: 6 (ref 5.0–8.0)
Protein, ur: NEGATIVE mg/dL
SPECIFIC GRAVITY, URINE: 1.021 (ref 1.005–1.030)
Squamous Epithelial / LPF: NONE SEEN

## 2014-12-04 LAB — URINE CULTURE: CULTURE: NO GROWTH

## 2020-08-13 DEATH — deceased
